# Patient Record
Sex: Male | Born: 1970 | Race: White | Hispanic: No | Marital: Married | Smoking: Current every day smoker
Health system: Southern US, Community
[De-identification: ages and names within clinical notes are randomized; demographics above are authoritative.]

## PROBLEM LIST (undated history)

## (undated) DIAGNOSIS — M549 Dorsalgia, unspecified: Secondary | ICD-10-CM

## (undated) DIAGNOSIS — K429 Umbilical hernia without obstruction or gangrene: Secondary | ICD-10-CM

## (undated) DIAGNOSIS — G8929 Other chronic pain: Secondary | ICD-10-CM

## (undated) DIAGNOSIS — I10 Essential (primary) hypertension: Secondary | ICD-10-CM

## (undated) HISTORY — PX: BACK SURGERY: SHX140

## (undated) HISTORY — PX: OTHER SURGICAL HISTORY: SHX169

---

## 2002-04-22 ENCOUNTER — Ambulatory Visit (HOSPITAL_COMMUNITY): Admission: RE | Admit: 2002-04-22 | Discharge: 2002-04-22 | Payer: Self-pay | Admitting: *Deleted

## 2002-04-22 ENCOUNTER — Encounter: Payer: Self-pay | Admitting: *Deleted

## 2002-06-19 ENCOUNTER — Emergency Department (HOSPITAL_COMMUNITY): Admission: EM | Admit: 2002-06-19 | Discharge: 2002-06-19 | Payer: Self-pay | Admitting: Emergency Medicine

## 2002-08-01 ENCOUNTER — Encounter: Payer: Self-pay | Admitting: General Surgery

## 2002-08-01 ENCOUNTER — Ambulatory Visit (HOSPITAL_COMMUNITY): Admission: RE | Admit: 2002-08-01 | Discharge: 2002-08-01 | Payer: Self-pay | Admitting: General Surgery

## 2002-08-06 ENCOUNTER — Ambulatory Visit (HOSPITAL_COMMUNITY): Admission: RE | Admit: 2002-08-06 | Discharge: 2002-08-06 | Payer: Self-pay | Admitting: General Surgery

## 2002-08-06 ENCOUNTER — Encounter: Payer: Self-pay | Admitting: General Surgery

## 2004-02-19 ENCOUNTER — Ambulatory Visit (HOSPITAL_COMMUNITY): Admission: RE | Admit: 2004-02-19 | Discharge: 2004-02-19 | Payer: Self-pay | Admitting: Family Medicine

## 2008-05-29 ENCOUNTER — Emergency Department (HOSPITAL_COMMUNITY): Admission: EM | Admit: 2008-05-29 | Discharge: 2008-05-29 | Payer: Self-pay | Admitting: Emergency Medicine

## 2008-10-02 ENCOUNTER — Encounter: Admission: RE | Admit: 2008-10-02 | Discharge: 2008-10-02 | Payer: Self-pay | Admitting: Neurosurgery

## 2008-11-25 ENCOUNTER — Inpatient Hospital Stay (HOSPITAL_COMMUNITY): Admission: RE | Admit: 2008-11-25 | Discharge: 2008-11-28 | Payer: Self-pay | Admitting: Neurosurgery

## 2009-07-20 ENCOUNTER — Encounter: Admission: RE | Admit: 2009-07-20 | Discharge: 2009-07-20 | Payer: Self-pay | Admitting: Neurosurgery

## 2009-09-04 ENCOUNTER — Encounter: Admission: RE | Admit: 2009-09-04 | Discharge: 2009-09-04 | Payer: Self-pay | Admitting: Neurosurgery

## 2009-10-03 ENCOUNTER — Emergency Department (HOSPITAL_COMMUNITY): Admission: EM | Admit: 2009-10-03 | Discharge: 2009-10-04 | Payer: Self-pay | Admitting: Emergency Medicine

## 2010-09-22 LAB — CBC
Hemoglobin: 13.6 g/dL (ref 13.0–17.0)
MCHC: 34.8 g/dL (ref 30.0–36.0)
MCV: 84.6 fL (ref 78.0–100.0)
RBC: 4.62 MIL/uL (ref 4.22–5.81)
RDW: 13.5 % (ref 11.5–15.5)
WBC: 9.4 10*3/uL (ref 4.0–10.5)

## 2010-09-22 LAB — BASIC METABOLIC PANEL
BUN: 22 mg/dL (ref 6–23)
Calcium: 9.4 mg/dL (ref 8.4–10.5)
Chloride: 106 mEq/L (ref 96–112)
Creatinine, Ser: 0.98 mg/dL (ref 0.4–1.5)
GFR calc Af Amer: >60 mL/min (ref >60)
GFR calc non Af Amer: >60 mL/min (ref >60)
Sodium: 140 mEq/L (ref 135–145)

## 2010-09-22 LAB — COMPREHENSIVE METABOLIC PANEL
Albumin: 4 g/dL (ref 3.5–5.2)
Alkaline Phosphatase: 73 U/L (ref 39–117)
BUN: 22 mg/dL (ref 6–23)
CO2: 26 mEq/L (ref 19–32)
Calcium: 9.3 mg/dL (ref 8.4–10.5)
Creatinine, Ser: 0.99 mg/dL (ref 0.4–1.5)
GFR calc non Af Amer: >60 mL/min (ref >60)

## 2010-09-22 LAB — DIFFERENTIAL
Eosinophils Relative: 4 % (ref 0–5)
Lymphocytes Relative: 55 % — ABNORMAL HIGH (ref 12–46)
Neutro Abs: 2.8 10*3/uL (ref 1.7–7.7)

## 2010-09-22 LAB — LIPASE, BLOOD: Lipase: 31 U/L (ref 11–59)

## 2010-10-12 LAB — CBC
HCT: 43.2 % (ref 39.0–52.0)
MCHC: 33.7 g/dL (ref 30.0–36.0)
Platelets: 187 10*3/uL (ref 150–400)
RBC: 5.02 MIL/uL (ref 4.22–5.81)
RDW: 13.3 % (ref 11.5–15.5)
WBC: 6.5 10*3/uL (ref 4.0–10.5)

## 2010-10-12 LAB — COMPREHENSIVE METABOLIC PANEL
ALT: 44 U/L (ref 0–53)
Albumin: 4.2 g/dL (ref 3.5–5.2)
CO2: 27 mEq/L (ref 19–32)
Calcium: 9.9 mg/dL (ref 8.4–10.5)
Chloride: 107 mEq/L (ref 96–112)
GFR calc Af Amer: >60 mL/min (ref >60)
Potassium: 4.2 mEq/L (ref 3.5–5.1)
Sodium: 140 mEq/L (ref 135–145)

## 2010-10-12 LAB — URINALYSIS, ROUTINE W REFLEX MICROSCOPIC
Bilirubin Urine: NEGATIVE
Glucose, UA: NEGATIVE mg/dL
Nitrite: NEGATIVE
Protein, ur: NEGATIVE mg/dL
Specific Gravity, Urine: 1.034 — ABNORMAL HIGH (ref 1.005–1.030)
pH: 5.5 (ref 5.0–8.0)

## 2010-10-12 LAB — DIFFERENTIAL
Basophils Relative: 1 % (ref 0–1)
Lymphs Abs: 2.8 10*3/uL (ref 0.7–4.0)
Monocytes Absolute: 0.5 10*3/uL (ref 0.1–1.0)

## 2010-10-12 LAB — PROTIME-INR: INR: 0.9 (ref 0.00–1.49)

## 2010-10-12 LAB — TYPE AND SCREEN
ABO/RH(D): A POS
Antibody Screen: NEGATIVE

## 2010-11-16 NOTE — Discharge Summary (Signed)
NAME:  [place], [place]                ACCOUNT NO.:  [id number]   MEDICAL RECORD NO.:  [id number]          PATIENT TYPE:  INP   LOCATION:  3029                         FACILITY:  [facility]   PHYSICIAN:  Payton Doughty, M.D.      DATE OF BIRTH:  02-13-1971   DATE OF ADMISSION:  11/25/2008  DATE OF DISCHARGE:  11/28/2008                               DISCHARGE SUMMARY   ADMITTING DIAGNOSIS:  Spondylosis at L5-S1.   DISCHARGE DIAGNOSIS:  Spondylosis at L5-S1.   OPERATIVE PROCEDURE:  L5-S1 laminectomy, diskectomy, posterior lumbar  interbody fusion, nonsegmental pedicle screw fixation.   DICTATING DOCTOR:  Payton Doughty, MD   COMPLICATIONS:  None.   DISCHARGE STATUS:  Alive and well.   BODY TEXT:  A 40 year old right-handed white gentleman whose history and  physical is recounted in the chart, and had two diskectomies by the  doctors in the past, had left leg pain, positive diskography and is  admitted for fusion.  Medical history is fairly unremarkable.  He was  admitted after ascertaining normal laboratory values and underwent L5-S1  fusion.  Postoperatively, he has done well.  Foley was removed.  First  day had to be replaced for one night.  Now, he is eating and voiding on  his own.  PCA was stopped on second postoperative day.  He is on oral  pain medications.  His incision is dry and well healing.  His strength  is full.  His leg pain has gone.  He has been discharged home in the  care of his family.  His follow up will be in the [place] offices in a  week for sutures.           ______________________________  Payton Doughty, M.D.     MWR/MEDQ  D:  11/28/2008  T:  11/28/2008  Job:  [id number]

## 2010-11-16 NOTE — H&P (Signed)
NAME:  [place] Murphy, Clinton Murphy                ACCOUNT NO.:  [id number]   MEDICAL RECORD NO.:  [id number]          PATIENT TYPE:  INP   LOCATION:  2899                         FACILITY:  [facility]   PHYSICIAN:  Payton Doughty, M.D.      DATE OF BIRTH:  July 20, 1970   DATE OF ADMISSION:  11/25/2008  DATE OF DISCHARGE:                              HISTORY & PHYSICAL   ADMITTING DIAGNOSIS:  Spondylosis, L5-S1.   BODY TEXT:  A 40 year old right-handed white gentleman in November 2009  when cutting wood had the sharp onset of pain in his back and down his  left leg.  Occasional pain in his right, but mostly it is in his left.  It is worse when he is up and about.  Underwent discography.  It was  strongly and concordantly positive at L5-S1.  He has done epidural  steroids, which were not helpful and he is admitted now for fusion.   PAST MEDICAL HISTORY:  Remarkable for mental illness.  He does not have  any medication since he is not having any trouble at this time.   MEDICATIONS:  He is only taking Lortab.   ALLERGIES:  Has no allergies.   PAST SURGICAL HISTORY:  Disk operation in 2000, disk operation in 2006,  and elbow operation in 2002.   SOCIAL HISTORY:  She smokes a pack a day.  Does not drink alcohol, and  is on disability.   FAMILY HISTORY:  Mom is 47, has breast cancer.  Dad died at 90 of  pneumonia.   REVIEW OF SYSTEMS:  CONSTITUTIONAL:  Remarkable for tenderness, leg  pain, leg weakness, and back pain.  HEENT:  Within normal limits.  NECK:  He has reasonable range of motion of the neck.  CHEST:  Clear.  CARDIAC:  Regular rate and rhythm.  ABDOMEN:  Soft and nontender with no hepatosplenomegaly.  EXTREMITIES:  No clubbing or cyanosis.  Peripheral pulses are good.  GU:  Deferred.  NEUROLOGIC:  He is awake, alert, and oriented.  Cranial nerves are  intact.  Motor exam shows 5/5 strength throughout the upper and lower  extremities.  He has sensory dysesthesias in the left S1 distribution.  Reflexes are 2 at the knees, 1 at the right ankle, and absent at the  left.  Straight leg raise is not particularly positive.  MR and lumbar  discography are positive at L5-S1, L4-5 is relatively well preserved.  On the discography, it was indifferent.   CLINICAL IMPRESSION:  Severe lumbar spondylosis at L5-S1.   PLAN:  Lumbar laminectomy, discectomy, posterior lumbar interbody  fusion, possible pedicle screws.  The risks and benefits have been  discussed with me, and he wished to proceed.           ______________________________  Payton Doughty, M.D.     MWR/MEDQ  D:  11/25/2008  T:  11/25/2008  Job:  [id number]

## 2010-11-16 NOTE — Op Note (Signed)
NAME:  [place] Murphy, Clinton Murphy                ACCOUNT NO.:  [id number]   MEDICAL RECORD NO.:  [id number]          PATIENT TYPE:  INP   LOCATION:  3029                         FACILITY:  [facility]   PHYSICIAN:  Payton Doughty, M.D.      DATE OF BIRTH:  October 02, 1970   DATE OF PROCEDURE:  11/25/2008  DATE OF DISCHARGE:                               OPERATIVE REPORT   PREOPERATIVE DIAGNOSIS:  Spondylosis and disk at L5-S1.   POSTOPERATIVE DIAGNOSIS:  Spondylosis and disk at L5-S1.   OPERATIVE PROCEDURES:  L5-S1 laminectomy and posterior lumbar interbody  fusion with a PEEK cage nonsegmental pedicle screw fixation at L5-S1 and  posterolateral arthrodesis, L5-S1.   SURGEON:  Payton Doughty, MD   ANESTHESIA:  General endotracheal.   PREPARATION:  Prepped and draped with alcohol wipe.   COMPLICATIONS:  None.   NURSE ASSISTANT:  Bedelia Person, MD   DOCTOR ASSISTANT:  Cristi Loron, MD   BODY OF TEXT:  A 40 year old gentleman with severe spondylosis at 5-1.  Taken to operating room, smoothly anesthetized and intubated, placed  prone on the operating table.  Following shave, prep and drape in the  usual sterile fashion, the scar was reopened, dissected down to the  spinous process of 3 and spinous process of 1 that were remaining.  Working through the scar, the pedicle of L5-S1 and the transverse  process and pedicle of L5 and the sacral ala were all dissected free.  Intraoperative x-ray confirmed correctness of level.  Having confirmed  correctness of level, the pars interarticularis, remaining lamina, and  inferior facet of L5 was removed bilaterally as well as the superior  portion of the superior facet of S1.  This allowed access to the  foraminal area bilaterally.  On the left side, there was extremely  scarred nerve root, was quite large and very scarred in and there was  very little interpedicular distance.  It was certainly not possible to  mobilize the root and get into the disk space at that  level.  On the  right side which had previous apparently not been operated, there was  some epidural fat which could be dissected and the disk space accessed  in that fashion.  Following access of the disk space, diskectomy was  carried out and 9-mm, 4-degree lordosis PEEK cage was tapped into place  after it was packed with bone graft harvested from the facets.  Pedicles  were then probed, tapped, and head screws placed bilaterally.  Intraoperative x-ray showed one screw was miss directed and it was  retracted without difficulty.  Rods were attached and locked on.  The  transverse process of 5 and the sacral ala were decorticated and packed  with bone graft, mixed with the patient's own  bone and BMP.  Final x-ray showed good placement of rods, screws, and  interbody graft.  Successive layers of 0 Vicryl, 2-0 Vicryl, and 3-0  nylon were used to close.  Betadine and Telfa dressing was applied, made  occlusive with OpSite.  The patient returned to the recovery room in  good condition.  ______________________________  Payton Doughty, M.D.     MWR/MEDQ  D:  11/25/2008  T:  11/26/2008  Job:  [id number]

## 2010-11-27 ENCOUNTER — Emergency Department (HOSPITAL_COMMUNITY)
Admission: EM | Admit: 2010-11-27 | Discharge: 2010-11-28 | Disposition: A | Discharge status: Home / Self Care | Payer: Medicaid Other | Attending: Emergency Medicine | Admitting: Emergency Medicine

## 2010-11-27 DIAGNOSIS — F17200 Nicotine dependence, unspecified, uncomplicated: Secondary | ICD-10-CM | POA: Insufficient documentation

## 2010-11-27 DIAGNOSIS — I10 Essential (primary) hypertension: Secondary | ICD-10-CM | POA: Insufficient documentation

## 2010-11-27 DIAGNOSIS — L0231 Cutaneous abscess of buttock: Secondary | ICD-10-CM | POA: Insufficient documentation

## 2011-01-12 IMAGING — CR DG LUMBAR SPINE COMPLETE 4+V
1 series · 1 of 1 positions shown · non-contrast
Comparison: CT 10/02/2008

CLINICAL DATA: L5-S1 laminectomy/discectomy/effusion

LUMBAR SPINE - COMPLETE 4+ VIEW

[view not recorded]
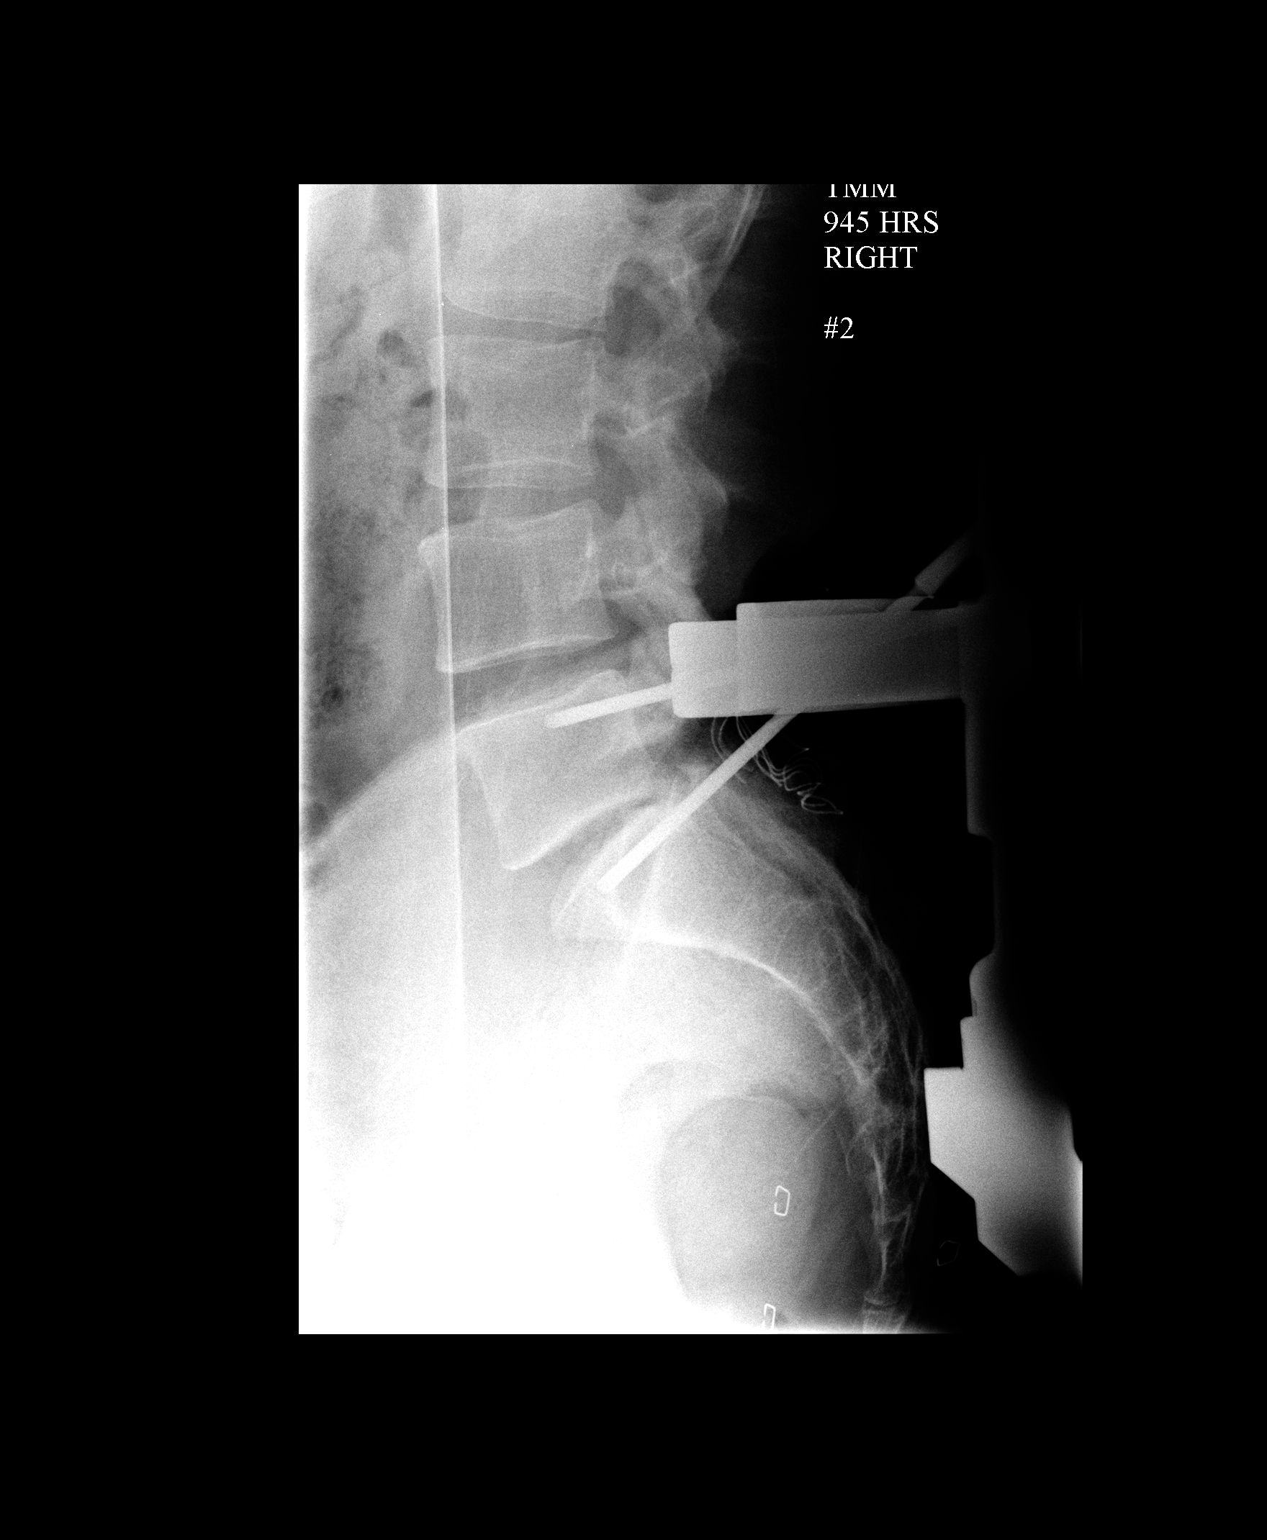

[1 of 1 positions shown; findings below may reference images not displayed]

FINDINGS: Four cross-table lateral views are received.  Film #1 was
taken 8778 hours revealing retractors in position and pointer aimed
at the mid aspect of L5 body.  Film #2 was taken 1673 hours
revealing two pointers, one projected over the superior aspect of
L5 body and the other over the superior aspect of S1 body.  Film #3
was taken at 9999 hours revealing single screws at L5 and S1. Film
#4 was taken at 4381 hours and reveals paired transpedicle screws
at L5 and S1.  No spondylolisthesis.
IMPRESSION: Satisfactory appearance of transpedicle screws at L5-S1.

## 2011-01-12 IMAGING — CR DG LUMBAR SPINE COMPLETE 4+V
1 series · 1 of 1 positions shown · non-contrast
Comparison: CT 10/02/2008

CLINICAL DATA: L5-S1 laminectomy/discectomy/effusion

LUMBAR SPINE - COMPLETE 4+ VIEW

[view not recorded]
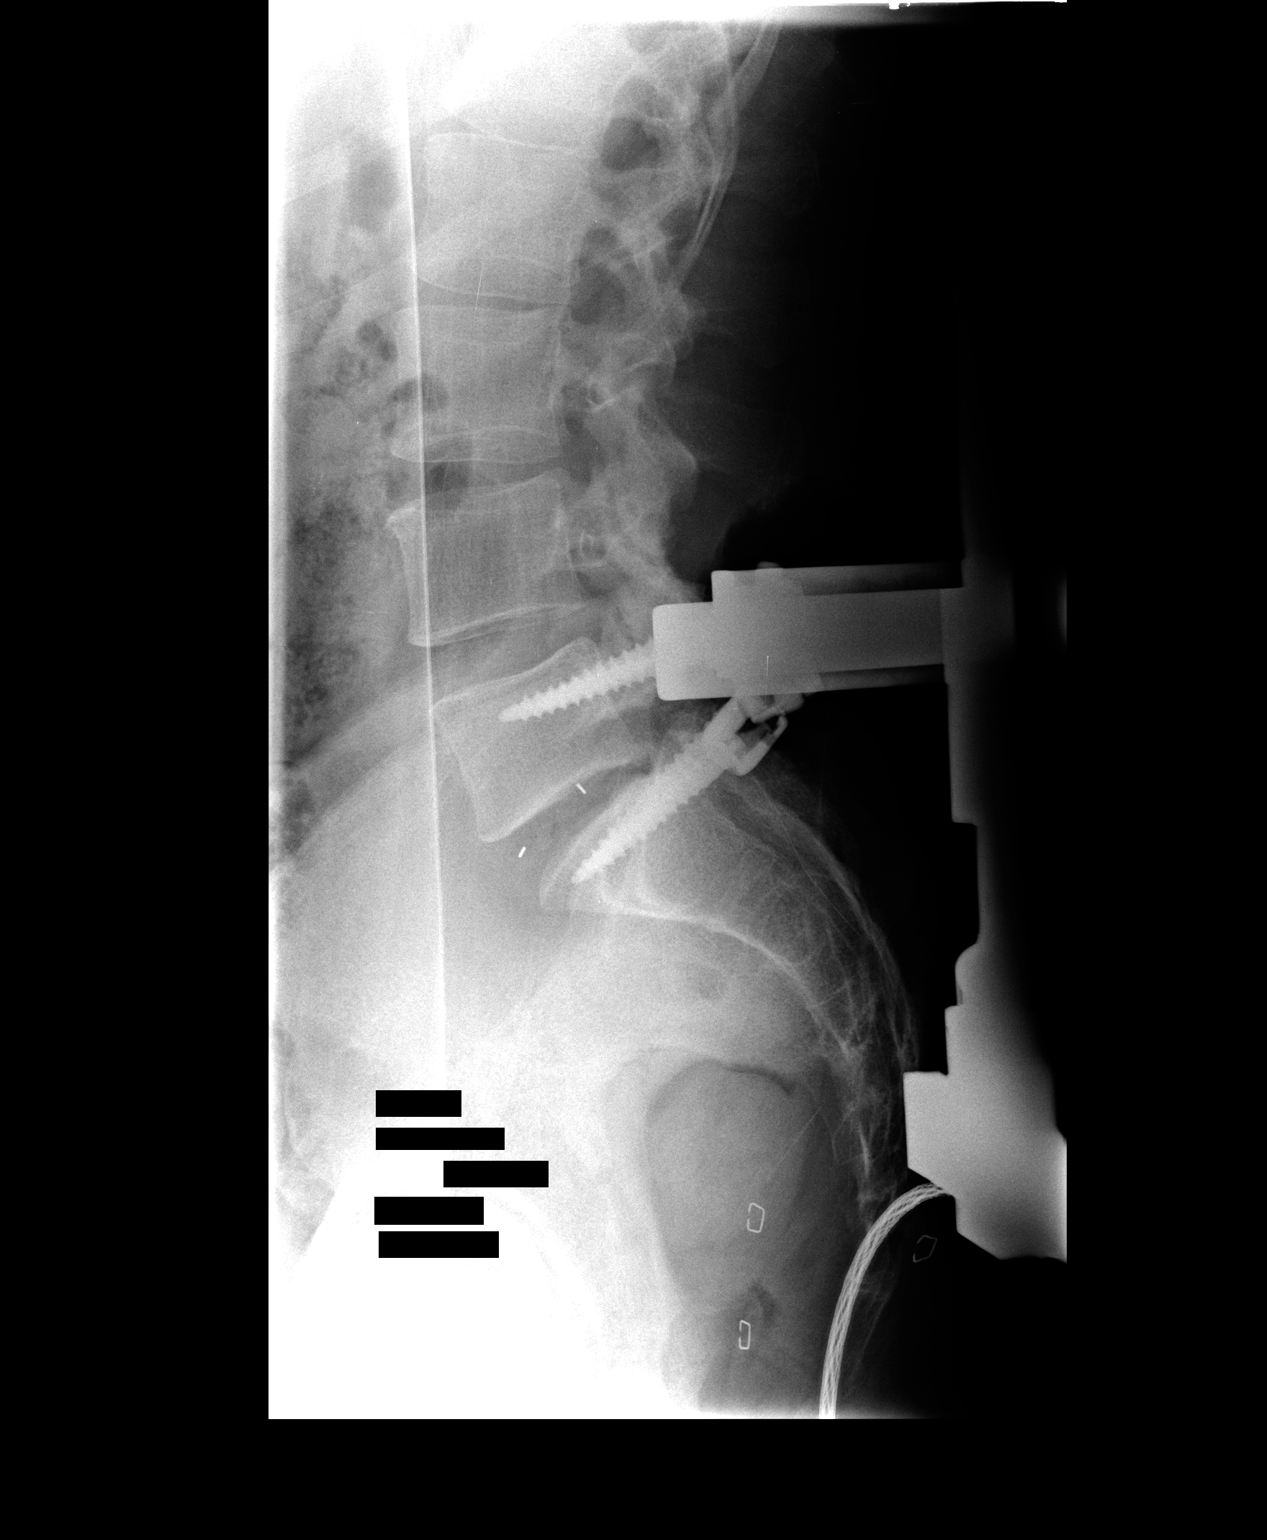

[1 of 1 positions shown; findings below may reference images not displayed]

FINDINGS: Four cross-table lateral views are received.  Film #1 was
taken 8778 hours revealing retractors in position and pointer aimed
at the mid aspect of L5 body.  Film #2 was taken 1673 hours
revealing two pointers, one projected over the superior aspect of
L5 body and the other over the superior aspect of S1 body.  Film #3
was taken at 9999 hours revealing single screws at L5 and S1. Film
#4 was taken at 4381 hours and reveals paired transpedicle screws
at L5 and S1.  No spondylolisthesis.
IMPRESSION: Satisfactory appearance of transpedicle screws at L5-S1.

## 2013-06-13 ENCOUNTER — Encounter (HOSPITAL_COMMUNITY): Payer: Self-pay | Admitting: Emergency Medicine

## 2013-06-13 ENCOUNTER — Emergency Department (HOSPITAL_COMMUNITY)
Admission: EM | Admit: 2013-06-13 | Discharge: 2013-06-13 | Disposition: A | Discharge status: Home / Self Care | Payer: Medicare Other | Attending: Emergency Medicine | Admitting: Emergency Medicine

## 2013-06-13 DIAGNOSIS — F17200 Nicotine dependence, unspecified, uncomplicated: Secondary | ICD-10-CM | POA: Insufficient documentation

## 2013-06-13 DIAGNOSIS — G8929 Other chronic pain: Secondary | ICD-10-CM | POA: Insufficient documentation

## 2013-06-13 DIAGNOSIS — H6692 Otitis media, unspecified, left ear: Secondary | ICD-10-CM

## 2013-06-13 DIAGNOSIS — H6690 Otitis media, unspecified, unspecified ear: Secondary | ICD-10-CM | POA: Insufficient documentation

## 2013-06-13 HISTORY — DX: Other chronic pain: G89.29

## 2013-06-13 HISTORY — DX: Dorsalgia, unspecified: M54.9

## 2013-06-13 MED ORDER — AMOXICILLIN 500 MG PO CAPS: 500.0000 mg | ORAL_CAPSULE | Freq: Three times a day (TID) | ORAL | Status: DC

## 2013-06-13 MED ORDER — HYDROCODONE-ACETAMINOPHEN 5-325 MG PO TABS: 2.0000 | ORAL_TABLET | ORAL | Status: DC | PRN

## 2013-06-13 NOTE — ED Notes (Signed)
Left ear pain x 2 days

## 2013-06-13 NOTE — ED Provider Notes (Signed)
CSN: [id number]     Arrival date & time 06/13/13  1547 History   First MD Initiated Contact with Patient 06/13/13 1610     Chief Complaint  Patient presents with  . Otalgia   (Consider location/radiation/quality/duration/timing/severity/associated sxs/prior Treatment) HPI Comments: Patient here with 2 day history of left ear pain - states this started gradually, has been trying to wash the ear out with hydrogen peroxide, this has not helped.  Has noticed drainage the first day, none now.  Reports tinnitis is chronic.  Denies fever, chills, sore throat, nausea, vomiting, headache.  Patient is a 42 y.o. male presenting with ear pain. The history is provided by the patient and the spouse. No language interpreter was used.  Otalgia Location:  Left Behind ear:  No abnormality Quality:  Throbbing Severity:  Moderate Onset quality:  Gradual Duration:  2 days Timing:  Constant Progression:  Worsening Chronicity:  New Context: not direct blow, not foreign body in ear and no water in ear   Relieved by:  Nothing Worsened by:  Nothing tried Ineffective treatments:  None tried Associated symptoms: tinnitus   Associated symptoms: no ear discharge, no fever, no headaches, no hearing loss, no rash, no rhinorrhea and no sore throat   Risk factors: no chronic ear infection and no prior ear surgery     Past Medical History  Diagnosis Date  . Chronic back pain    Past Surgical History  Procedure Laterality Date  . Back surgery    . Arm surgery     No family history on file. History  Substance Use Topics  . Smoking status: Current Every Day Smoker    Types: Cigarettes  . Smokeless tobacco: Not on file  . Alcohol Use: No    Review of Systems  Constitutional: Negative for fever.  HENT: Positive for ear pain and tinnitus. Negative for ear discharge, hearing loss, rhinorrhea and sore throat.   Skin: Negative for rash.  Neurological: Negative for headaches.  All other systems reviewed and  are negative.    Allergies  Review of patient's allergies indicates no known allergies.  Home Medications   Current Outpatient Rx  Name  Route  Sig  Dispense  Refill  . HYDROcodone-acetaminophen (NORCO) 10-325 MG per tablet   Oral   Take 1 tablet by mouth every 6 (six) hours as needed. pain          BP 141/93  Pulse 74  Temp(Src) 98.4 F (36.9 C) (Oral)  Resp 18  Ht 5\' 5"  (1.651 m)  Wt 168 lb (76.204 kg)  BMI 27.96 kg/m2  SpO2 97% Physical Exam  Nursing note and vitals reviewed. Constitutional: He is oriented to person, place, and time. He appears well-developed and well-nourished. No distress.  HENT:  Head: Normocephalic and atraumatic.  Right Ear: External ear normal.  Nose: Nose normal.  Mouth/Throat: Oropharynx is clear and moist. No oropharyngeal exudate.  Pinna with erythema, no mastoid process tenderness, canal erythematous without exudate, TM bulging and erythematous.  Eyes: Conjunctivae are normal. Pupils are equal, round, and reactive to light. No scleral icterus.  Neck: Normal range of motion. Neck supple.  Cardiovascular: Normal rate, regular rhythm and normal heart sounds.  Exam reveals no gallop and no friction rub.   No murmur heard. Pulmonary/Chest: Effort normal and breath sounds normal. No respiratory distress. He has no wheezes. He has no rales. He exhibits no tenderness.  Abdominal: Soft. Bowel sounds are normal. He exhibits no distension. There is no tenderness.  Musculoskeletal: Normal range of motion. He exhibits no edema and no tenderness.  Lymphadenopathy:    He has no cervical adenopathy.  Neurological: He is alert and oriented to person, place, and time. No cranial nerve deficit. He exhibits normal muscle tone. Coordination normal.  Skin: Skin is warm and dry. No rash noted. No erythema. No pallor.  Psychiatric: He has a normal mood and affect. His behavior is normal. Judgment and thought content normal.    ED Course  Procedures (including  critical care time) Labs Review Labs Reviewed - No data to display Imaging Review No results found.  EKG Interpretation   None       MDM  Left OM  Patient here with left ear pain, no evidence of mastoiditis, no further drainage and no sign for otitis externa at this time.  Plan is to place on oral abx, short course of pain medication and return if needed.   Izola Price Marisue Humble, PA-C 06/13/13 1621

## 2013-06-14 NOTE — ED Provider Notes (Signed)
Medical screening examination/treatment/procedure(s) were performed by non-physician practitioner and as supervising physician I was immediately available for consultation/collaboration.  EKG Interpretation   None       Kaeley Vinje, MD, FACEP   Leshea Jaggers L Julyana Woolverton, MD 06/14/13 0013 

## 2013-07-29 ENCOUNTER — Encounter (HOSPITAL_COMMUNITY): Payer: Self-pay | Admitting: Emergency Medicine

## 2013-07-29 ENCOUNTER — Emergency Department (HOSPITAL_COMMUNITY)
Admission: EM | Admit: 2013-07-29 | Discharge: 2013-07-29 | Disposition: A | Discharge status: Home / Self Care | Payer: Medicare Other | Attending: Emergency Medicine | Admitting: Emergency Medicine

## 2013-07-29 DIAGNOSIS — F17200 Nicotine dependence, unspecified, uncomplicated: Secondary | ICD-10-CM | POA: Insufficient documentation

## 2013-07-29 DIAGNOSIS — G8929 Other chronic pain: Secondary | ICD-10-CM | POA: Insufficient documentation

## 2013-07-29 DIAGNOSIS — M549 Dorsalgia, unspecified: Secondary | ICD-10-CM | POA: Insufficient documentation

## 2013-07-29 DIAGNOSIS — IMO0002 Reserved for concepts with insufficient information to code with codable children: Secondary | ICD-10-CM | POA: Insufficient documentation

## 2013-07-29 DIAGNOSIS — K644 Residual hemorrhoidal skin tags: Secondary | ICD-10-CM | POA: Insufficient documentation

## 2013-07-29 MED ORDER — HYDROCODONE-ACETAMINOPHEN 5-325 MG PO TABS: 1.0000 | ORAL_TABLET | ORAL | Status: DC | PRN

## 2013-07-29 MED ORDER — HYDROCODONE-ACETAMINOPHEN 5-325 MG PO TABS
2.0000 | ORAL_TABLET | Freq: Once | ORAL | Status: AC
  Administered 2013-07-29: 2 via ORAL
  Filled 2013-07-29: qty 2

## 2013-07-29 MED ORDER — HYDROCORTISONE ACETATE 25 MG RE SUPP: 25.0000 mg | Freq: Three times a day (TID) | RECTAL | Status: DC

## 2013-07-29 NOTE — Discharge Instructions (Signed)
You have a large hemorrhoid present externally. Please call Dr. Lovell Sheehan for surgical evaluation of this hemorrhoid. Please use warm tub soaks 2-3 times daily. Please increase the fiber in your diet. Please use a stool softener until seen by the surgeon. Please use Anusol HC rectally 3 times daily. May use Norco if needed for pain. This medication may cause drowsiness, please use with caution. Hemorrhoids Hemorrhoids are swollen veins around the rectum or anus. There are two types of hemorrhoids:   Internal hemorrhoids. These occur in the veins just inside the rectum. They may poke through to the outside and become irritated and painful.  External hemorrhoids. These occur in the veins outside the anus and can be felt as a painful swelling or hard lump near the anus. CAUSES  Pregnancy.   Obesity.   Constipation or diarrhea.   Straining to have a bowel movement.   Sitting for long periods on the toilet.  Heavy lifting or other activity that caused you to strain.  Anal intercourse. SYMPTOMS   Pain.   Anal itching or irritation.   Rectal bleeding.   Fecal leakage.   Anal swelling.   One or more lumps around the anus.  DIAGNOSIS  Your caregiver may be able to diagnose hemorrhoids by visual examination. Other examinations or tests that may be performed include:   Examination of the rectal area with a gloved hand (digital rectal exam).   Examination of anal canal using a small tube (scope).   A blood test if you have lost a significant amount of blood.  A test to look inside the colon (sigmoidoscopy or colonoscopy). TREATMENT Most hemorrhoids can be treated at home. However, if symptoms do not seem to be getting better or if you have a lot of rectal bleeding, your caregiver may perform a procedure to help make the hemorrhoids get smaller or remove them completely. Possible treatments include:   Placing a rubber band at the base of the hemorrhoid to cut off the  circulation (rubber band ligation).   Injecting a chemical to shrink the hemorrhoid (sclerotherapy).   Using a tool to burn the hemorrhoid (infrared light therapy).   Surgically removing the hemorrhoid (hemorrhoidectomy).   Stapling the hemorrhoid to block blood flow to the tissue (hemorrhoid stapling).  HOME CARE INSTRUCTIONS   Eat foods with fiber, such as whole grains, beans, nuts, fruits, and vegetables. Ask your doctor about taking products with added fiber in them (fibersupplements).  Increase fluid intake. Drink enough water and fluids to keep your urine clear or pale yellow.   Exercise regularly.   Go to the bathroom when you have the urge to have a bowel movement. Do not wait.   Avoid straining to have bowel movements.   Keep the anal area dry and clean. Use wet toilet paper or moist towelettes after a bowel movement.   Medicated creams and suppositories may be used or applied as directed.   Only take over-the-counter or prescription medicines as directed by your caregiver.   Take warm sitz baths for 15 20 minutes, 3 4 times a day to ease pain and discomfort.   Place ice packs on the hemorrhoids if they are tender and swollen. Using ice packs between sitz baths may be helpful.   Put ice in a plastic bag.   Place a towel between your skin and the bag.   Leave the ice on for 15 20 minutes, 3 4 times a day.   Do not use a donut-shaped pillow or  sit on the toilet for long periods. This increases blood pooling and pain.  SEEK MEDICAL CARE IF:  You have increasing pain and swelling that is not controlled by treatment or medicine.  You have uncontrolled bleeding.  You have difficulty or you are unable to have a bowel movement.  You have pain or inflammation outside the area of the hemorrhoids. MAKE SURE YOU:  Understand these instructions.  Will watch your condition.  Will get help right away if you are not doing well or get worse. Document  Released: 06/17/2000 Document Revised: 06/06/2012 Document Reviewed: 04/24/2012 [facility]erneExitCare Patient Information 2014 [place], [place].  Fiber Content in Foods Drinking plenty of fluids and consuming foods high in fiber can help with constipation. See the list below for the fiber content of some common foods. Starches and Grains / Dietary Fiber (g)  Cheerios, 1 cup / 3 g  Kellogg's Corn Flakes, 1 cup / 0.7 g  Klopf Krispies, 1  cup / 0.3 g  Quaker Oat Life Cereal,  cup / 2.1 g  Oatmeal, instant (cooked),  cup / 2 g  Kellogg's Frosted Mini Wheats, 1 cup / 5.1 g  [place], brown, long-grain (cooked), 1 cup / 3.5 g  Hisle, white, long-grain (cooked), 1 cup / 0.6 g  Macaroni, cooked, enriched, 1 cup / 2.5 g Legumes / Dietary Fiber (g)  Beans, baked, canned, plain or vegetarian,  cup / 5.2 g  Beans, kidney, canned,  cup / 6.8 g  Beans, pinto, dried (cooked),  cup / 7.7 g  Beans, pinto, canned,  cup / 5.5 g Breads and Crackers / Dietary Fiber (g)  Graham crackers, plain or honey, 2 squares / 0.7 g  Saltine crackers, 3 squares / 0.3 g  Pretzels, plain, salted, 10 pieces / 1.8 g  Bread, whole-wheat, 1 slice / 1.9 g  Bread, white, 1 slice / 0.7 g  Bread, raisin, 1 slice / 1.2 g  Bagel, plain, 3 oz / 2 g  Tortilla, flour, 1 oz / 0.9 g  Tortilla, corn, 1 small / 1.5 g  Bun, hamburger or hotdog, 1 small / 0.9 g Fruits / Dietary Fiber (g)  Apple, raw with skin, 1 medium / 4.4 g  Applesauce, sweetened,  cup / 1.5 g  Banana,  medium / 1.5 g  Grapes, 10 grapes / 0.4 g  Orange, 1 small / 2.3 g  Raisin, 1.5 oz / 1.6 g  Melon, 1 cup / 1.4 g Vegetables / Dietary Fiber (g)  Green beans, canned,  cup / 1.3 g  Carrots (cooked),  cup / 2.3 g  Broccoli (cooked),  cup / 2.8 g  Peas, frozen (cooked),  cup / 4.4 g  Potatoes, mashed,  cup / 1.6 g  Lettuce, 1 cup / 0.5 g  Corn, canned,  cup / 1.6 g  Tomato,  cup / 1.1 g Document Released: 11/06/2006  Document Revised: 09/12/2011 Document Reviewed: 01/01/2007 Exit[employer] Patient Information 2014 [place], [place].

## 2013-07-29 NOTE — ED Notes (Signed)
Pt reports his hemorrhoids have fallen out and he has trouble putting back in his rectum per pt. Denies bleeding. Denies having trouble with bowels.

## 2013-07-29 NOTE — ED Provider Notes (Signed)
CSN: [id number]     Arrival date & time 07/29/13  1146 History   First MD Initiated Contact with Patient 07/29/13 1421     Chief Complaint  Patient presents with  . Hemorrhoids   (Consider location/radiation/quality/duration/timing/severity/associated sxs/prior Treatment) HPI Comments: Pt is a 43 y/o male who presents to the ED with c/o pain and  A "growth" on his rectum. No recent procedures. No recent injury. Pt reports some constipation at times, put this does not occur often. He states he does a lot of heavy lifting. He has not had this pain or growth in the past.   The history is provided by the patient.    Past Medical History  Diagnosis Date  . Chronic back pain    Past Surgical History  Procedure Laterality Date  . Back surgery    . Arm surgery     No family history on file. History  Substance Use Topics  . Smoking status: Current Every Day Smoker    Types: Cigarettes  . Smokeless tobacco: Not on file  . Alcohol Use: No    Review of Systems  Constitutional: Negative for activity change.       All ROS Neg except as noted in HPI  HENT: Negative for nosebleeds.   Eyes: Negative for photophobia and discharge.  Respiratory: Negative for cough, shortness of breath and wheezing.   Cardiovascular: Negative for chest pain and palpitations.  Gastrointestinal: Negative for abdominal pain and blood in stool.  Genitourinary: Negative for dysuria, frequency and hematuria.  Musculoskeletal: Positive for back pain. Negative for arthralgias and neck pain.  Skin: Negative.   Neurological: Negative for dizziness, seizures and speech difficulty.  Psychiatric/Behavioral: Negative for hallucinations and confusion.    Allergies  Review of patient's allergies indicates no known allergies.  Home Medications   Current Outpatient Rx  Name  Route  Sig  Dispense  Refill  . HYDROcodone-acetaminophen (NORCO) 10-325 MG per tablet   Oral   Take 1 tablet by mouth every 6 (six) hours as  needed. pain         . HYDROcodone-acetaminophen (NORCO) 5-325 MG per tablet   Oral   Take 1 tablet by mouth every 4 (four) hours as needed for moderate pain.   15 tablet   0   . hydrocortisone (ANUSOL-HC) 25 MG suppository   Rectal   Place 1 suppository (25 mg total) rectally 3 (three) times daily.   12 suppository   0    BP 138/100  Temp(Src) 97.7 F (36.5 C)  Resp 16  SpO2 98% Physical Exam  Nursing note and vitals reviewed. Constitutional: He is oriented to person, place, and time. He appears well-developed and well-nourished.  Non-toxic appearance.  HENT:  Head: Normocephalic.  Right Ear: Tympanic membrane and external ear normal.  Left Ear: Tympanic membrane and external ear normal.  Eyes: EOM and lids are normal. Pupils are equal, round, and reactive to light.  Neck: Normal range of motion. Neck supple. Carotid bruit is not present.  Cardiovascular: Normal rate, regular rhythm, normal heart sounds, intact distal pulses and normal pulses.   Pulmonary/Chest: Breath sounds normal. No respiratory distress.  Abdominal: Soft. Bowel sounds are normal. There is no tenderness. There is no guarding.  Genitourinary:   large tender external hemorrhoid at the 3:00 position. No bleeding noted.   Musculoskeletal: Normal range of motion.  Lymphadenopathy:       Head (right side): No submandibular adenopathy present.       Head (left  side): No submandibular adenopathy present.    He has no cervical adenopathy.  Neurological: He is alert and oriented to person, place, and time. He has normal strength. No cranial nerve deficit or sensory deficit.  Skin: Skin is warm and dry.  Psychiatric: He has a normal mood and affect. His speech is normal.    ED Course  Procedures (including critical care time) Labs Review Labs Reviewed - No data to display Imaging Review No results found.  EKG Interpretation   None       MDM   1. External hemorrhoid    *I have reviewed nursing  notes, vital signs, and all appropriate lab and imaging results for this patient.**  Exam is consistent with external hemorrhoid. Pt given rx for anusol-HC.  And norco. Pt advised to see surgeon as soon as possible for evaluation. He is also advised to soak in warm tub soaks. Pt to return to ED if excessive bleeding or intractable pain or temp elevation.  Kathie DikeHobson M Verneal Wiers, PA-C 08/01/13 1457

## 2013-07-29 NOTE — ED Notes (Signed)
Rectal pain with swelling, of hemorrhoidal tissue.

## 2013-08-01 ENCOUNTER — Encounter (HOSPITAL_COMMUNITY): Payer: Self-pay

## 2013-08-01 ENCOUNTER — Encounter (HOSPITAL_COMMUNITY)
Admission: RE | Admit: 2013-08-01 | Discharge: 2013-08-01 | Disposition: A | Discharge status: Home / Self Care | Payer: Medicare Other | Source: Ambulatory Visit | Attending: General Surgery | Admitting: General Surgery

## 2013-08-01 DIAGNOSIS — Z01812 Encounter for preprocedural laboratory examination: Secondary | ICD-10-CM | POA: Insufficient documentation

## 2013-08-01 DIAGNOSIS — Z01818 Encounter for other preprocedural examination: Secondary | ICD-10-CM | POA: Insufficient documentation

## 2013-08-01 LAB — CBC WITH DIFFERENTIAL/PLATELET
BASOS PCT: 1 % (ref 0–1)
Basophils Absolute: 0.1 10*3/uL (ref 0.0–0.1)
EOS ABS: 0.3 10*3/uL (ref 0.0–0.7)
EOS PCT: 4 % (ref 0–5)
HCT: 43.7 % (ref 39.0–52.0)
Hemoglobin: 15 g/dL (ref 13.0–17.0)
Lymphocytes Relative: 40 % (ref 12–46)
Lymphs Abs: 3.3 10*3/uL (ref 0.7–4.0)
MCH: 28.9 pg (ref 26.0–34.0)
MCHC: 34.3 g/dL (ref 30.0–36.0)
MCV: 84.2 fL (ref 78.0–100.0)
Monocytes Absolute: 0.8 10*3/uL (ref 0.1–1.0)
Monocytes Relative: 10 % (ref 3–12)
Neutro Abs: 3.7 10*3/uL (ref 1.7–7.7)
Neutrophils Relative %: 46 % (ref 43–77)
PLATELETS: 227 10*3/uL (ref 150–400)
RBC: 5.19 MIL/uL (ref 4.22–5.81)
RDW: 13.7 % (ref 11.5–15.5)
WBC: 8.2 10*3/uL (ref 4.0–10.5)

## 2013-08-01 LAB — BASIC METABOLIC PANEL
BUN: 20 mg/dL (ref 6–23)
CALCIUM: 9.9 mg/dL (ref 8.4–10.5)
CO2: 30 meq/L (ref 19–32)
CREATININE: 1.14 mg/dL (ref 0.50–1.35)
Chloride: 100 mEq/L (ref 96–112)
GFR calc Af Amer: 89 mL/min — ABNORMAL LOW (ref >90)
GFR calc non Af Amer: 77 mL/min — ABNORMAL LOW (ref >90)
GLUCOSE: 93 mg/dL (ref 70–99)
Potassium: 4.4 mEq/L (ref 3.7–5.3)
Sodium: 141 mEq/L (ref 137–147)

## 2013-08-01 MED ORDER — CHLORHEXIDINE GLUCONATE 4 % EX LIQD: 1.0000 "application " | Freq: Once | CUTANEOUS | Status: DC

## 2013-08-01 NOTE — Patient Instructions (Signed)
Clinton Murphy  08/01/2013   Your procedure is scheduled on:  08/05/2013  Report to [facility] at  720  AM.  Call this number if you have problems the morning of surgery: [phone]   Remember:   Do not eat food or drink liquids after midnight.   Take these medicines the morning of surgery with A SIP OF WATER:  hydrocodone   Do not wear jewelry, make-up or nail polish.  Do not wear lotions, powders, or perfumes.   Do not shave 48 hours prior to surgery. Men may shave face and neck.  Do not bring valuables to the hospital.  [facility] is not responsible for any belongings or valuables.               Contacts, dentures or bridgework may not be worn into surgery.  Leave suitcase in the car. After surgery it may be brought to your room.  For patients admitted to the hospital, discharge time is determined by your treatment team.               Patients discharged the day of surgery will not be allowed to drive home.  Name and phone number of your driver: family  Special Instructions: Shower using CHG 2 nights before surgery and the night before surgery.  If you shower the day of surgery use CHG.  Use special wash - you have one bottle of CHG for all showers.  You should use approximately 1/3 of the bottle for each shower.   Please read over the following fact sheets that you were given: Pain Booklet, Coughing and Deep Breathing, Surgical Site Infection Prevention, Anesthesia Post-op Instructions and Care and Recovery After Surgery Hemorrhoids Hemorrhoids are swollen veins around the rectum or anus. There are two types of hemorrhoids:   Internal hemorrhoids. These occur in the veins just inside the rectum. They may poke through to the outside and become irritated and painful.  External hemorrhoids. These occur in the veins outside the anus and can be felt as a painful swelling or hard lump near the anus. CAUSES  Pregnancy.   Obesity.   Constipation or diarrhea.   Straining  to have a bowel movement.   Sitting for long periods on the toilet.  Heavy lifting or other activity that caused you to strain.  Anal intercourse. SYMPTOMS   Pain.   Anal itching or irritation.   Rectal bleeding.   Fecal leakage.   Anal swelling.   One or more lumps around the anus.  DIAGNOSIS  Your caregiver may be able to diagnose hemorrhoids by visual examination. Other examinations or tests that may be performed include:   Examination of the rectal area with a gloved hand (digital rectal exam).   Examination of anal canal using a small tube (scope).   A blood test if you have lost a significant amount of blood.  A test to look inside the colon (sigmoidoscopy or colonoscopy). TREATMENT Most hemorrhoids can be treated at home. However, if symptoms do not seem to be getting better or if you have a lot of rectal bleeding, your caregiver may perform a procedure to help make the hemorrhoids get smaller or remove them completely. Possible treatments include:   Placing a rubber band at the base of the hemorrhoid to cut off the circulation (rubber band ligation).   Injecting a chemical to shrink the hemorrhoid (sclerotherapy).   Using a tool to burn the hemorrhoid (infrared light therapy).  Surgically removing the hemorrhoid (hemorrhoidectomy).   Stapling the hemorrhoid to block blood flow to the tissue (hemorrhoid stapling).  HOME CARE INSTRUCTIONS   Eat foods with fiber, such as whole grains, beans, nuts, fruits, and vegetables. Ask your doctor about taking products with added fiber in them (fibersupplements).  Increase fluid intake. Drink enough water and fluids to keep your urine clear or pale yellow.   Exercise regularly.   Go to the bathroom when you have the urge to have a bowel movement. Do not wait.   Avoid straining to have bowel movements.   Keep the anal area dry and clean. Use wet toilet paper or moist towelettes after a bowel  movement.   Medicated creams and suppositories may be used or applied as directed.   Only take over-the-counter or prescription medicines as directed by your caregiver.   Take warm sitz baths for 15 20 minutes, 3 4 times a day to ease pain and discomfort.   Place ice packs on the hemorrhoids if they are tender and swollen. Using ice packs between sitz baths may be helpful.   Put ice in a plastic bag.   Place a towel between your skin and the bag.   Leave the ice on for 15 20 minutes, 3 4 times a day.   Do not use a donut-shaped pillow or sit on the toilet for long periods. This increases blood pooling and pain.  SEEK MEDICAL CARE IF:  You have increasing pain and swelling that is not controlled by treatment or medicine.  You have uncontrolled bleeding.  You have difficulty or you are unable to have a bowel movement.  You have pain or inflammation outside the area of the hemorrhoids. MAKE SURE YOU:  Understand these instructions.  Will watch your condition.  Will get help right away if you are not doing well or get worse. Document Released: 06/17/2000 Document Revised: 06/06/2012 Document Reviewed: 04/24/2012 [facility] Patient Information 2014 [place], [place]. PATIENT INSTRUCTIONS POST-ANESTHESIA  IMMEDIATELY FOLLOWING SURGERY:  Do not drive or operate machinery for the first twenty four hours after surgery.  Do not make any important decisions for twenty four hours after surgery or while taking narcotic pain medications or sedatives.  If you develop intractable nausea and vomiting or a severe headache please notify your doctor immediately.  FOLLOW-UP:  Please make an appointment with your surgeon as instructed. You do not need to follow up with anesthesia unless specifically instructed to do so.  WOUND CARE INSTRUCTIONS (if applicable):  Keep a dry clean dressing on the anesthesia/puncture wound site if there is drainage.  Once the wound has quit draining you may  leave it open to air.  Generally you should leave the bandage intact for twenty four hours unless there is drainage.  If the epidural site drains for more than 36-48 hours please call the anesthesia department.  QUESTIONS?:  Please feel free to call your physician or the hospital operator if you have any questions, and they will be happy to assist you.

## 2013-08-01 NOTE — H&P (Signed)
  NTS SOAP Note  Vital Signs:  Vitals as of: 08/01/2013: Systolic 153: Diastolic 94: Heart Rate 70: Temp 98.4F: Height 5ft 5in: Weight 174Lbs 0 Ounces: Pain Level 9: BMI 28.95  BMI : 28.95 kg/m2  Subjective: This 43 Years 9 Months old Male presents for of    HEMORRHOIDS: ,Has developed significantly swollen hemorrhoids over the past week.  Has had a h/o hemorrhoidal disease.  Creams have not been helpful.  Seen in ER recently.  Review of Symptoms:  Constitutional:unremarkable   Head:unremarkable    Eyes:unremarkable   Nose/Mouth/Throat:unremarkable Cardiovascular:  unremarkable   Respiratory:unremarkable   Gastrointestinal:  unremarkable   Genitourinary:unremarkable       back pain Skin:unremarkable Hematolgic/Lymphatic:unremarkable     Allergic/Immunologic:unremarkable     Past Medical History:    Reviewed  Past Medical History  Surgical History: back surgery x 4 Medical Problems: none Allergies: nkda Medications: norco   Social History:Reviewed  Social History  Preferred Language: English Race:  White Ethnicity: Not Hispanic / Latino Age: 43 Years 9 Months Marital Status:  S Alcohol: yes   Smoking Status: Current every day smoker reviewed on 08/01/2013 Started Date: 07/04/1988 Packs per day: 1.00 Functional Status reviewed on 08/01/2013 ------------------------------------------------ Bathing: Normal Cooking: Normal Dressing: Normal Driving: Normal Eating: Normal Managing Meds: Normal Oral Care: Normal Shopping: Normal Toileting: Normal Transferring: Normal Walking: Normal Cognitive Status reviewed on 08/01/2013 ------------------------------------------------ Attention: Normal Decision Making: Normal Language: Normal Memory: Normal Motor: Normal Perception: Normal Problem Solving: Normal Visual and Spatial: Normal   Family History:  Reviewed  Family Health History Mother, Deceased; Breast  cancer; Lung cancer;  Father, Deceased; Healthy; mesothelioma    Objective Information: General:  Well appearing, well nourished in no distress. Neck:  Supple without lymphadenopathy.  Heart:  RRR, no murmur Lungs:    CTA bilaterally, no wheezes, rhonchi, rales.  Breathing unlabored. Abdomen:Soft, NT/ND, no HSM, no masses.   Extensive circumferential hemorrhoidal disease present.  Exam limited secondary to pain.  Assessment:Hemorrhoidal disease  Diagnoses: 455.6 Hemorrhoids (Fourth degree hemorrhoids)  Procedures: 99203 - OFFICE OUTPATIENT NEW 30 MINUTES    Plan:Scheduled for extensive hemorrhoidectomy on 08/05/13.   Patient Education:Alternative treatments to surgery were discussed with patient (and family).  Risks and benefits  of procedure including bleeding, infection, and recurrence of the hemorrhoids were fully explained to the patient (and family) who gave informed consent. Patient/family questions were addressed.  Follow-up:Pending Surgery 

## 2013-08-05 ENCOUNTER — Ambulatory Visit (HOSPITAL_COMMUNITY): Payer: Medicare Other | Admitting: Anesthesiology

## 2013-08-05 ENCOUNTER — Ambulatory Visit (HOSPITAL_COMMUNITY)
Admission: RE | Admit: 2013-08-05 | Discharge: 2013-08-05 | Disposition: A | Discharge status: Home / Self Care | Payer: Medicare Other | Source: Ambulatory Visit | Attending: General Surgery | Admitting: General Surgery

## 2013-08-05 ENCOUNTER — Encounter (HOSPITAL_COMMUNITY): Payer: Medicare Other | Admitting: Anesthesiology

## 2013-08-05 ENCOUNTER — Encounter (HOSPITAL_COMMUNITY)
Admission: RE | Disposition: A | Discharge status: Home / Self Care | Payer: Self-pay | Source: Ambulatory Visit | Attending: General Surgery

## 2013-08-05 ENCOUNTER — Encounter (HOSPITAL_COMMUNITY): Payer: Self-pay | Admitting: *Deleted

## 2013-08-05 DIAGNOSIS — Z6828 Body mass index (BMI) 28.0-28.9, adult: Secondary | ICD-10-CM | POA: Insufficient documentation

## 2013-08-05 DIAGNOSIS — F17200 Nicotine dependence, unspecified, uncomplicated: Secondary | ICD-10-CM | POA: Insufficient documentation

## 2013-08-05 DIAGNOSIS — K644 Residual hemorrhoidal skin tags: Secondary | ICD-10-CM | POA: Insufficient documentation

## 2013-08-05 DIAGNOSIS — K648 Other hemorrhoids: Secondary | ICD-10-CM | POA: Insufficient documentation

## 2013-08-05 HISTORY — PX: HEMORRHOID SURGERY: SHX153

## 2013-08-05 SURGERY — HEMORRHOIDECTOMY
Anesthesia: General

## 2013-08-05 MED ORDER — ONDANSETRON HCL 4 MG/2ML IJ SOLN
4.0000 mg | Freq: Once | INTRAMUSCULAR | Status: AC
  Administered 2013-08-05: 4 mg via INTRAVENOUS

## 2013-08-05 MED ORDER — FENTANYL CITRATE 0.05 MG/ML IJ SOLN
INTRAMUSCULAR | Status: AC
  Filled 2013-08-05: qty 2

## 2013-08-05 MED ORDER — BUPIVACAINE HCL (PF) 0.5 % IJ SOLN
INTRAMUSCULAR | Status: AC
  Filled 2013-08-05: qty 30

## 2013-08-05 MED ORDER — LIDOCAINE VISCOUS 2 % MT SOLN
OROMUCOSAL | Status: DC | PRN
  Administered 2013-08-05: 20 mL

## 2013-08-05 MED ORDER — FENTANYL CITRATE 0.05 MG/ML IJ SOLN
25.0000 ug | INTRAMUSCULAR | Status: DC | PRN
  Administered 2013-08-05: 50 ug via INTRAVENOUS
  Administered 2013-08-05 (×2): 25 ug via INTRAVENOUS
  Administered 2013-08-05 (×2): 50 ug via INTRAVENOUS

## 2013-08-05 MED ORDER — SODIUM CHLORIDE 0.9 % IR SOLN
Status: DC | PRN
  Administered 2013-08-05: 1000 mL

## 2013-08-05 MED ORDER — HYDROMORPHONE HCL PF 1 MG/ML IJ SOLN
INTRAMUSCULAR | Status: AC
  Filled 2013-08-05: qty 1

## 2013-08-05 MED ORDER — GLYCOPYRROLATE 0.2 MG/ML IJ SOLN
INTRAMUSCULAR | Status: AC
  Filled 2013-08-05: qty 1

## 2013-08-05 MED ORDER — HEMOSTATIC AGENTS (NO CHARGE) OPTIME
TOPICAL | Status: DC | PRN
  Administered 2013-08-05: 1 via TOPICAL

## 2013-08-05 MED ORDER — MIDAZOLAM HCL 2 MG/2ML IJ SOLN
INTRAMUSCULAR | Status: AC
  Filled 2013-08-05: qty 2

## 2013-08-05 MED ORDER — FENTANYL CITRATE 0.05 MG/ML IJ SOLN
INTRAMUSCULAR | Status: AC
  Filled 2013-08-05: qty 5

## 2013-08-05 MED ORDER — FENTANYL CITRATE 0.05 MG/ML IJ SOLN
25.0000 ug | INTRAMUSCULAR | Status: AC
  Administered 2013-08-05 (×2): 25 ug via INTRAVENOUS

## 2013-08-05 MED ORDER — DEXTROSE 5 % IV SOLN
INTRAVENOUS | Status: AC
  Filled 2013-08-05: qty 2

## 2013-08-05 MED ORDER — KETOROLAC TROMETHAMINE 30 MG/ML IJ SOLN
30.0000 mg | Freq: Once | INTRAMUSCULAR | Status: AC
  Administered 2013-08-05: 30 mg via INTRAVENOUS
  Filled 2013-08-05: qty 1

## 2013-08-05 MED ORDER — HYDROMORPHONE HCL PF 1 MG/ML IJ SOLN
1.0000 mg | INTRAMUSCULAR | Status: DC | PRN
  Administered 2013-08-05 (×3): 1 mg via INTRAVENOUS
  Filled 2013-08-05: qty 1

## 2013-08-05 MED ORDER — LACTATED RINGERS IV SOLN
INTRAVENOUS | Status: DC
  Administered 2013-08-05: 08:00:00 via INTRAVENOUS

## 2013-08-05 MED ORDER — BUPIVACAINE HCL (PF) 0.5 % IJ SOLN
INTRAMUSCULAR | Status: DC | PRN
  Administered 2013-08-05: 9 mL

## 2013-08-05 MED ORDER — MIDAZOLAM HCL 2 MG/2ML IJ SOLN
1.0000 mg | INTRAMUSCULAR | Status: DC | PRN
  Administered 2013-08-05: 2 mg via INTRAVENOUS

## 2013-08-05 MED ORDER — LIDOCAINE HCL (CARDIAC) 20 MG/ML IV SOLN
INTRAVENOUS | Status: DC | PRN
  Administered 2013-08-05: 50 mg via INTRAVENOUS

## 2013-08-05 MED ORDER — DEXTROSE 5 % IV SOLN
2.0000 g | INTRAVENOUS | Status: AC
  Administered 2013-08-05: 2 g via INTRAVENOUS

## 2013-08-05 MED ORDER — ONDANSETRON HCL 4 MG/2ML IJ SOLN: 4.0000 mg | Freq: Once | INTRAMUSCULAR | Status: DC | PRN

## 2013-08-05 MED ORDER — OXYCODONE-ACETAMINOPHEN 7.5-325 MG PO TABS: 1.0000 | ORAL_TABLET | ORAL | Status: DC | PRN

## 2013-08-05 MED ORDER — ZOLPIDEM TARTRATE ER 6.25 MG PO TBCR: 6.2500 mg | EXTENDED_RELEASE_TABLET | Freq: Every evening | ORAL | Status: DC | PRN

## 2013-08-05 MED ORDER — ONDANSETRON HCL 4 MG/2ML IJ SOLN
INTRAMUSCULAR | Status: AC
  Filled 2013-08-05: qty 2

## 2013-08-05 MED ORDER — FENTANYL CITRATE 0.05 MG/ML IJ SOLN
INTRAMUSCULAR | Status: DC | PRN
  Administered 2013-08-05: 100 ug via INTRAVENOUS
  Administered 2013-08-05 (×3): 50 ug via INTRAVENOUS

## 2013-08-05 MED ORDER — MIDAZOLAM HCL 5 MG/5ML IJ SOLN
INTRAMUSCULAR | Status: DC | PRN
  Administered 2013-08-05: 2 mg via INTRAVENOUS

## 2013-08-05 MED ORDER — LACTATED RINGERS IV SOLN
INTRAVENOUS | Status: DC | PRN
  Administered 2013-08-05: 08:00:00 via INTRAVENOUS

## 2013-08-05 MED ORDER — GLYCOPYRROLATE 0.2 MG/ML IJ SOLN
0.2000 mg | Freq: Once | INTRAMUSCULAR | Status: AC
  Administered 2013-08-05: 0.2 mg via INTRAVENOUS

## 2013-08-05 MED ORDER — LIDOCAINE VISCOUS 2 % MT SOLN
OROMUCOSAL | Status: AC
  Filled 2013-08-05: qty 15

## 2013-08-05 MED ORDER — PROPOFOL 10 MG/ML IV BOLUS
INTRAVENOUS | Status: DC | PRN
  Administered 2013-08-05: 180 mg via INTRAVENOUS

## 2013-08-05 MED ORDER — PROPOFOL 10 MG/ML IV BOLUS
INTRAVENOUS | Status: AC
  Filled 2013-08-05: qty 20

## 2013-08-05 SURGICAL SUPPLY — 29 items
BAG HAMPER (MISCELLANEOUS) ×3 IMPLANT
CLOTH BEACON ORANGE TIMEOUT ST (SAFETY) ×3 IMPLANT
COVER LIGHT HANDLE STERIS (MISCELLANEOUS) ×6 IMPLANT
COVER MAYO STAND XLG (DRAPE) ×3 IMPLANT
DECANTER SPIKE VIAL GLASS SM (MISCELLANEOUS) ×3 IMPLANT
DRAPE PROXIMA HALF (DRAPES) ×3 IMPLANT
ELECT REM PT RETURN 9FT ADLT (ELECTROSURGICAL) ×3
ELECTRODE REM PT RTRN 9FT ADLT (ELECTROSURGICAL) ×1 IMPLANT
FORMALIN 10 PREFIL 120ML (MISCELLANEOUS) ×3 IMPLANT
GLOVE BIO SURGEON STRL SZ7.5 (GLOVE) ×3 IMPLANT
GLOVE BIOGEL PI IND STRL 7.5 (GLOVE) ×1 IMPLANT
GLOVE BIOGEL PI INDICATOR 7.5 (GLOVE) ×2
GLOVE ECLIPSE 7.0 STRL STRAW (GLOVE) ×3 IMPLANT
GLOVE EXAM NITRILE MD LF STRL (GLOVE) ×3 IMPLANT
GOWN STRL REUS W/TWL LRG LVL3 (GOWN DISPOSABLE) ×6 IMPLANT
HEMOSTAT SURGICEL 4X8 (HEMOSTASIS) ×3 IMPLANT
KIT ROOM TURNOVER AP CYSTO (KITS) ×3 IMPLANT
LIGASURE IMPACT 36 18CM CVD LR (INSTRUMENTS) ×3 IMPLANT
MANIFOLD NEPTUNE II (INSTRUMENTS) ×3 IMPLANT
NEEDLE HYPO 25X1 1.5 SAFETY (NEEDLE) ×3 IMPLANT
NS IRRIG 1000ML POUR BTL (IV SOLUTION) ×3 IMPLANT
PACK PERI GYN (CUSTOM PROCEDURE TRAY) ×3 IMPLANT
PAD ARMBOARD 7.5X6 YLW CONV (MISCELLANEOUS) ×3 IMPLANT
SET BASIN LINEN APH (SET/KITS/TRAYS/PACK) ×3 IMPLANT
SPONGE GAUZE 4X4 12PLY (GAUZE/BANDAGES/DRESSINGS) ×3 IMPLANT
SURGILUBE 3G PEEL PACK STRL (MISCELLANEOUS) ×3 IMPLANT
SUT SILK 0 FSL (SUTURE) ×3 IMPLANT
SUT VIC AB 2-0 CT2 27 (SUTURE) ×3 IMPLANT
SYR CONTROL 10ML LL (SYRINGE) ×3 IMPLANT

## 2013-08-05 NOTE — Transfer of Care (Signed)
Immediate Anesthesia Transfer of Care Note  Patient: Clinton Murphy  Procedure(s) Performed: Procedure(s): EXTENSIVE HEMORRHOIDECTOMY (N/A)  Patient Location: PACU  Anesthesia Type:General  Level of Consciousness: awake, alert , oriented and patient cooperative  Airway & Oxygen Therapy: Patient Spontanous Breathing and Patient connected to face mask oxygen  Post-op Assessment: Report given to PACU RN and Post -op Vital signs reviewed and stable  Post vital signs: Reviewed and stable  Complications: No apparent anesthesia complications

## 2013-08-05 NOTE — Discharge Instructions (Signed)
Hemorrhoidectomy °Care After °Hemorrhoidectomy is the removal of enlarged (dilated) veins around the rectum. Until the surgical areas are healed, control of pain and avoiding constipation are the greatest challenges for patients.  °For as long as 24 hours after receiving an anesthetic (the medication that made you sleep), and while taking narcotic pain relievers, you may feel dizzy, weak and drowsy. For that reason, the following information applies to the first 24-hour period following surgery, and continues for as long as you are taking narcotic pain medications. °· Do not drive a car, ride a bicycle, participate in activities in which you could be hurt. Do not take public transportation until you are off narcotic pain medications and until your caregiver says it is okay. °· Do not drink alcohol, take tranquilizers, or medications not prescribed or allowed by your surgical caregiver. °· Do not sign important papers or contracts for at least 24 hours or while taking narcotic medications. °· Have a responsible person with you for 24 hours. °RISKS AND COMPLICATIONS °Some problems that may occur following this procedure include: °· Infection. A germ starts growing in the tissue surrounding the site operated on. This can usually be treated with antibiotics. °· Damage to the rectal sphincter could occur. This is the muscle that opens in your anus to allow a bowel movement. This could cause incontinence. This is uncommon. °· Bleeding following surgery can be a complication of almost any surgery. Your surgeon takes every precaution to keep this from happening. °· Complications of anesthesia. °HOME CARE INSTRUCTIONS °· Avoid straining when having bowel movements. °· Avoid heavy lifting (more than 10 pounds (4.5 kilograms)). °· Only take over-the-counter or prescription medicines for pain, discomfort, or fever as directed by your caregiver. °· Take hot sitz baths for 20 to 30 minutes, 3 to 4 times per day. °· To keep  swelling down, apply an ice pack for twenty minutes three to four times per day between sitz baths. Use a towel between your skin and the ice pack. Do not do this if it causes too much discomfort. °· Keep anal area clean and dry. Following a bowel movement, you can gently wash the area with tucks (available for purchase at a drugstore) or cotton swabs. Gently pat the area dry. Do not rub the area. °· Eat a well balanced diet and drink 6 to 8 glasses of water every day to avoid constipation. A bulk laxative may be also be helpful. °SEEK MEDICAL CARE IF:  °· You have increasing pain or tenderness near or in the surgical site. °· You are unable to eat or drink. °· You develop nausea or vomiting. °· You develop uncontrolled bleeding such as soaking two to three pads in one hour. °· You have constipation, not helped by changing your diet or increasing your fluid intake. Pain medications are a common cause of constipation. °· You have pain and redness (inflammation) extending outside the area of your surgery. °· You develop an unexplained oral temperature above 102° F (38.9° C), or any other signs of infection. °· You have any other questions or concerns following surgery. °Document Released: 09/10/2003 Document Revised: 09/12/2011 Document Reviewed: 12/08/2008 °ExitCare® Patient Information ©2014 [employer] LLC. ° °

## 2013-08-05 NOTE — Anesthesia Preprocedure Evaluation (Signed)
Anesthesia Evaluation  Patient identified by MRN, date of birth, ID band Patient awake    Reviewed: Allergy & Precautions, H&P , NPO status , Patient's Chart, lab work & pertinent test results  Airway Mallampati: III TM Distance: >3 FB   Mouth opening: Limited Mouth Opening  Dental  (+) Poor Dentition, Missing and Loose   Pulmonary Current Smoker (am cough),  breath sounds clear to auscultation        Cardiovascular negative cardio ROS  Rhythm:Regular Rate:Normal     Neuro/Psych    GI/Hepatic negative GI ROS,   Endo/Other    Renal/GU      Musculoskeletal   Abdominal   Peds  Hematology   Anesthesia Other Findings   Reproductive/Obstetrics                           Anesthesia Physical Anesthesia Plan  ASA: II  Anesthesia Plan: General   Post-op Pain Management:    Induction: Intravenous  Airway Management Planned: LMA  Additional Equipment:   Intra-op Plan:   Post-operative Plan: Extubation in OR  Informed Consent: I have reviewed the patients History and Physical, chart, labs and discussed the procedure including the risks, benefits and alternatives for the proposed anesthesia with the patient or authorized representative who has indicated his/her understanding and acceptance.     Plan Discussed with:   Anesthesia Plan Comments:         Anesthesia Quick Evaluation

## 2013-08-05 NOTE — Anesthesia Postprocedure Evaluation (Signed)
  Anesthesia Post-op Note  Patient: Clinton RossettiJeffrey S Murphy  Procedure(s) Performed: Procedure(s): EXTENSIVE HEMORRHOIDECTOMY (N/A)  Patient Location: PACU  Anesthesia Type:General  Level of Consciousness: awake, alert , oriented and patient cooperative  Airway and Oxygen Therapy: Patient Spontanous Breathing and Patient connected to face mask oxygen  Post-op Pain: Mild  Post-op Assessment: Post-op Vital signs reviewed, Patient's Cardiovascular Status Stable, Respiratory Function Stable, Patent Airway, No signs of Nausea or vomiting and Pain level controlled  Post-op Vital Signs: Reviewed and stable  Complications: No apparent anesthesia complications

## 2013-08-05 NOTE — Anesthesia Procedure Notes (Addendum)
Procedure Name: LMA Insertion Performed by: Moshe SalisburyANIEL, Destynie Toomey E Pre-anesthesia Checklist: Patient identified, Patient being monitored, Emergency Drugs available, Timeout performed and Suction available Patient Re-evaluated:Patient Re-evaluated prior to inductionOxygen Delivery Method: Circle System Utilized Preoxygenation: Pre-oxygenation with 100% oxygen Intubation Type: IV induction Ventilation: Mask ventilation without difficulty LMA: LMA inserted LMA Size: 4.0 Number of attempts: 1 Placement Confirmation: positive ETCO2 and breath sounds checked- equal and bilateral Comments: Induction/LMA by A.Adams,CRNA, no problems

## 2013-08-05 NOTE — Interval H&P Note (Signed)
History and Physical Interval Note:  08/05/2013 8:00 AM  Clinton Murphy  has presented today for surgery, with the diagnosis of thrombosed hemorrhoid  The various methods of treatment have been discussed with the patient and family. After consideration of risks, benefits and other options for treatment, the patient has consented to  Procedure(s): EXTENSIVE HEMORRHOIDECTOMY (N/A) as a surgical intervention .  The patient's history has been reviewed, patient examined, no change in status, stable for surgery.  I have reviewed the patient's chart and labs.  Questions were answered to the patient's satisfaction.     Franky MachoJENKINS,[name]

## 2013-08-05 NOTE — Preoperative (Signed)
Beta Blockers   Reason not to administer Beta Blockers:Not Applicable 

## 2013-08-05 NOTE — Op Note (Signed)
Patient:  Clinton RossettiJeffrey S Murphy  DOB:  12/04/1970  MRN:  [id number]   Preop Diagnosis:  Internal and external hemorrhoidal disease  Postop Diagnosis:  Same  Procedure:  Extensive hemorrhoidectomy  Surgeon:  Franky MachoMark Marino Rogerson, M.D.  Anes:  General  Indications:  Patient is a 43 year old white male who presents with significant hemorrhoidal disease. As both internal and external hemorrhoidal disease. The risks and benefits of the procedure including bleeding, infection, and recurrence of the hemorrhoids were fully explained to the patient, who gave informed consent.  Procedure note:  The patient was placed in the lithotomy position after general anesthesia was administered. The perineum was prepped and draped using usual sterile technique with Betadine. Surgical site confirmation was performed.  On examination, the patient had a large internal and external hemorrhoid at the 8:00 position. He also had significant external hemorrhoidal disease from the 2 to 5:00 position. Other less significant hemorrhoidal disease was noted, though the patient had been told preoperatively that I could not get all the hemorrhoids and would only take care of those that were symptomatic. Using a LigaSure, the internal and external hemorrhoid at the o'clock position was excised. Likewise, the LigaSure was used to take care of the external hemorrhoids from the 2 to 5:00 position. A 2-0 Vicryl interrupted suture was used to reapproximate the mucosa. Care was taken to avoid the external sphincter mechanism. After the procedure, 2 fingers were easily able to be placed within the anus. The external sphincter was noted be intact. 0.5% Sensorcaine was instilled the surrounding wound. Surgicel and Viscous Xylocaine rectal packing was then placed.  All tape and needle counts were correct at the end of the procedure. The patient was awakened and transferred to PACU in stable condition.  Complications:  None  EBL:  Minimal  Specimen:   Hemorrhoids

## 2013-08-06 ENCOUNTER — Encounter (HOSPITAL_COMMUNITY): Payer: Self-pay | Admitting: General Surgery

## 2013-08-07 NOTE — ED Provider Notes (Signed)
Medical screening examination/treatment/procedure(s) were performed by non-physician practitioner and as supervising physician I was immediately available for consultation/collaboration.  EKG Interpretation   None       Devoria AlbeIva Chynna Buerkle, MD, Armando GangFACEP   Ward GivensIva L Akshitha Culmer, MD 08/07/13 [phone]

## 2015-02-07 ENCOUNTER — Encounter (HOSPITAL_COMMUNITY): Payer: Self-pay | Admitting: Emergency Medicine

## 2015-02-07 ENCOUNTER — Emergency Department (HOSPITAL_COMMUNITY)
Admission: EM | Admit: 2015-02-07 | Discharge: 2015-02-07 | Disposition: A | Discharge status: Home / Self Care | Payer: Medicare Other | Attending: Emergency Medicine | Admitting: Emergency Medicine

## 2015-02-07 DIAGNOSIS — G8929 Other chronic pain: Secondary | ICD-10-CM | POA: Insufficient documentation

## 2015-02-07 DIAGNOSIS — Z9889 Other specified postprocedural states: Secondary | ICD-10-CM | POA: Insufficient documentation

## 2015-02-07 DIAGNOSIS — Z720 Tobacco use: Secondary | ICD-10-CM | POA: Diagnosis not present

## 2015-02-07 DIAGNOSIS — M5441 Lumbago with sciatica, right side: Secondary | ICD-10-CM | POA: Insufficient documentation

## 2015-02-07 DIAGNOSIS — M549 Dorsalgia, unspecified: Secondary | ICD-10-CM | POA: Diagnosis present

## 2015-02-07 DIAGNOSIS — M5431 Sciatica, right side: Secondary | ICD-10-CM

## 2015-02-07 MED ORDER — OXYCODONE-ACETAMINOPHEN 5-325 MG PO TABS: 1.0000 | ORAL_TABLET | ORAL | Status: DC | PRN

## 2015-02-07 MED ORDER — OXYCODONE-ACETAMINOPHEN 5-325 MG PO TABS
2.0000 | ORAL_TABLET | Freq: Once | ORAL | Status: AC
  Administered 2015-02-07: 2 via ORAL
  Filled 2015-02-07: qty 2

## 2015-02-07 MED ORDER — NAPROXEN 500 MG PO TABS: 500.0000 mg | ORAL_TABLET | Freq: Two times a day (BID) | ORAL | Status: DC

## 2015-02-07 MED ORDER — CYCLOBENZAPRINE HCL 10 MG PO TABS: 10.0000 mg | ORAL_TABLET | Freq: Three times a day (TID) | ORAL | Status: DC | PRN

## 2015-02-07 NOTE — ED Notes (Signed)
Having pain to lower back radiating to right leg.  Rates pain 9/10.  History of back surgery with screws.
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
## 2015-02-07 NOTE — Discharge Instructions (Signed)
Sciatica °Sciatica is pain, weakness, numbness, or tingling along your sciatic nerve. The nerve starts in the lower back and runs down the back of each leg. Nerve damage or certain conditions pinch or put pressure on the sciatic nerve. This causes the pain, weakness, and other discomforts of sciatica. °HOME CARE  °· Only take medicine as told by your doctor. °· Apply ice to the affected area for 20 minutes. Do this 3-4 times a day for the first 48-72 hours. Then try heat in the same way. °· Exercise, stretch, or do your usual activities if these do not make your pain worse. °· Go to physical therapy as told by your doctor. °· Keep all doctor visits as told. °· Do not wear high heels or shoes that are not supportive. °· Get a firm mattress if your mattress is too soft to lessen pain and discomfort. °GET HELP RIGHT AWAY IF:  °· You cannot control when you poop (bowel movement) or pee (urinate). °· You have more weakness in your lower back, lower belly (pelvis), butt (buttocks), or legs. °· You have redness or puffiness (swelling) of your back. °· You have a burning feeling when you pee. °· You have pain that gets worse when you lie down. °· You have pain that wakes you from your sleep. °· Your pain is worse than past pain. °· Your pain lasts longer than 4 weeks. °· You are suddenly losing weight without reason. °MAKE SURE YOU:  °· Understand these instructions. °· Will watch this condition. °· Will get help right away if you are not doing well or get worse. °Document Released: 03/29/2008 Document Revised: 12/20/2011 Document Reviewed: 10/30/2011 °ExitCare® Patient Information ©2015 [employer]. This information is not intended to replace advice given to you by your health care provider. Make sure you discuss any questions you have with your health care provider. ° °

## 2015-03-13 ENCOUNTER — Emergency Department (HOSPITAL_COMMUNITY): Payer: No Typology Code available for payment source

## 2015-03-13 ENCOUNTER — Emergency Department (HOSPITAL_COMMUNITY)
Admission: EM | Admit: 2015-03-13 | Discharge: 2015-03-13 | Disposition: A | Discharge status: Home / Self Care | Payer: No Typology Code available for payment source | Attending: Emergency Medicine | Admitting: Emergency Medicine

## 2015-03-13 ENCOUNTER — Encounter (HOSPITAL_COMMUNITY): Payer: Self-pay | Admitting: Emergency Medicine

## 2015-03-13 DIAGNOSIS — S3992XA Unspecified injury of lower back, initial encounter: Secondary | ICD-10-CM | POA: Diagnosis not present

## 2015-03-13 DIAGNOSIS — Z791 Long term (current) use of non-steroidal anti-inflammatories (NSAID): Secondary | ICD-10-CM | POA: Insufficient documentation

## 2015-03-13 DIAGNOSIS — Y998 Other external cause status: Secondary | ICD-10-CM | POA: Diagnosis not present

## 2015-03-13 DIAGNOSIS — M549 Dorsalgia, unspecified: Secondary | ICD-10-CM

## 2015-03-13 DIAGNOSIS — G8929 Other chronic pain: Secondary | ICD-10-CM | POA: Insufficient documentation

## 2015-03-13 DIAGNOSIS — Y9389 Activity, other specified: Secondary | ICD-10-CM | POA: Insufficient documentation

## 2015-03-13 DIAGNOSIS — Z720 Tobacco use: Secondary | ICD-10-CM | POA: Diagnosis not present

## 2015-03-13 DIAGNOSIS — Y92410 Unspecified street and highway as the place of occurrence of the external cause: Secondary | ICD-10-CM | POA: Diagnosis not present

## 2015-03-13 DIAGNOSIS — S199XXA Unspecified injury of neck, initial encounter: Secondary | ICD-10-CM | POA: Diagnosis not present

## 2015-03-13 DIAGNOSIS — S79912A Unspecified injury of left hip, initial encounter: Secondary | ICD-10-CM | POA: Diagnosis not present

## 2015-03-13 MED ORDER — HYDROCODONE-ACETAMINOPHEN 5-325 MG PO TABS: 2.0000 | ORAL_TABLET | ORAL | Status: DC | PRN

## 2015-03-13 MED ORDER — HYDROCODONE-ACETAMINOPHEN 5-325 MG PO TABS
1.0000 | ORAL_TABLET | Freq: Once | ORAL | Status: AC
  Administered 2015-03-13: 1 via ORAL
  Filled 2015-03-13: qty 1

## 2015-03-13 NOTE — ED Notes (Signed)
Patient transported to CT 
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
## 2015-03-13 NOTE — ED Notes (Signed)
Patient transported to X-ray 
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
## 2016-05-29 ENCOUNTER — Encounter (HOSPITAL_COMMUNITY): Payer: Self-pay | Admitting: Emergency Medicine

## 2016-05-29 ENCOUNTER — Emergency Department (HOSPITAL_COMMUNITY)
Admission: EM | Admit: 2016-05-29 | Discharge: 2016-05-29 | Disposition: A | Discharge status: Home / Self Care | Payer: Medicare Other | Attending: Emergency Medicine | Admitting: Emergency Medicine

## 2016-05-29 DIAGNOSIS — F17210 Nicotine dependence, cigarettes, uncomplicated: Secondary | ICD-10-CM | POA: Insufficient documentation

## 2016-05-29 DIAGNOSIS — Z791 Long term (current) use of non-steroidal anti-inflammatories (NSAID): Secondary | ICD-10-CM | POA: Diagnosis not present

## 2016-05-29 DIAGNOSIS — K439 Ventral hernia without obstruction or gangrene: Secondary | ICD-10-CM | POA: Insufficient documentation

## 2016-05-29 DIAGNOSIS — R1010 Upper abdominal pain, unspecified: Secondary | ICD-10-CM

## 2016-05-29 DIAGNOSIS — R1033 Periumbilical pain: Secondary | ICD-10-CM | POA: Diagnosis present

## 2016-05-29 DIAGNOSIS — R197 Diarrhea, unspecified: Secondary | ICD-10-CM | POA: Diagnosis not present

## 2016-05-29 HISTORY — DX: Umbilical hernia without obstruction or gangrene: K42.9

## 2016-05-29 LAB — CBC WITH DIFFERENTIAL/PLATELET
BASOS ABS: 0.1 10*3/uL (ref 0.0–0.1)
Basophils Relative: 1 %
Eosinophils Absolute: 0.4 10*3/uL (ref 0.0–0.7)
Eosinophils Relative: 3 %
HEMATOCRIT: 43.9 % (ref 39.0–52.0)
Hemoglobin: 14.9 g/dL (ref 13.0–17.0)
LYMPHS PCT: 53 %
Lymphs Abs: 5.6 10*3/uL — ABNORMAL HIGH (ref 0.7–4.0)
MCH: 28.8 pg (ref 26.0–34.0)
MCHC: 33.9 g/dL (ref 30.0–36.0)
MCV: 84.7 fL (ref 78.0–100.0)
MONO ABS: 0.9 10*3/uL (ref 0.1–1.0)
Monocytes Relative: 8 %
NEUTROS ABS: 3.8 10*3/uL (ref 1.7–7.7)
Neutrophils Relative %: 35 %
Platelets: 214 10*3/uL (ref 150–400)
RBC: 5.18 MIL/uL (ref 4.22–5.81)
RDW: 14.3 % (ref 11.5–15.5)
WBC: 10.8 10*3/uL — AB (ref 4.0–10.5)

## 2016-05-29 LAB — COMPREHENSIVE METABOLIC PANEL
ALT: 31 U/L (ref 17–63)
AST: 22 U/L (ref 15–41)
Albumin: 4.3 g/dL (ref 3.5–5.0)
Alkaline Phosphatase: 81 U/L (ref 38–126)
Anion gap: 6 (ref 5–15)
BUN: 23 mg/dL — AB (ref 6–20)
CHLORIDE: 106 mmol/L (ref 101–111)
CO2: 24 mmol/L (ref 22–32)
Calcium: 9.5 mg/dL (ref 8.9–10.3)
Creatinine, Ser: 0.9 mg/dL (ref 0.61–1.24)
GFR calc Af Amer: >60 mL/min (ref >60)
Glucose, Bld: 107 mg/dL — ABNORMAL HIGH (ref 65–99)
POTASSIUM: 3.5 mmol/L (ref 3.5–5.1)
Sodium: 136 mmol/L (ref 135–145)
Total Bilirubin: 0.3 mg/dL (ref 0.3–1.2)
Total Protein: 6.9 g/dL (ref 6.5–8.1)

## 2016-05-29 LAB — LIPASE, BLOOD: LIPASE: 49 U/L (ref 11–51)

## 2016-05-29 MED ORDER — ONDANSETRON 8 MG PO TBDP: 8.0000 mg | ORAL_TABLET | Freq: Three times a day (TID) | ORAL | 0 refills | Status: AC | PRN

## 2016-05-29 MED ORDER — FAMOTIDINE 20 MG PO TABS
20.0000 mg | ORAL_TABLET | Freq: Two times a day (BID) | ORAL | 0 refills | Status: AC
Start: 2016-05-29 — End: ?

## 2016-05-29 MED ORDER — SODIUM CHLORIDE 0.9 % IV BOLUS (SEPSIS)
1000.0000 mL | Freq: Once | INTRAVENOUS | Status: AC
  Administered 2016-05-29: 1000 mL via INTRAVENOUS

## 2016-05-29 MED ORDER — PANTOPRAZOLE SODIUM 40 MG PO TBEC
40.0000 mg | DELAYED_RELEASE_TABLET | Freq: Every day | ORAL | 0 refills | Status: AC
Start: 2016-05-29 — End: ?

## 2016-05-29 NOTE — ED Triage Notes (Signed)
Pt states he thinks his "hernia is acting up" because he is having pain at site and acid reflux.
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
## 2018-05-12 ENCOUNTER — Emergency Department (HOSPITAL_COMMUNITY)
Admission: EM | Admit: 2018-05-12 | Discharge: 2018-05-12 | Disposition: A | Discharge status: Home / Self Care | Payer: Medicare Other | Attending: Emergency Medicine | Admitting: Emergency Medicine

## 2018-05-12 ENCOUNTER — Encounter (HOSPITAL_COMMUNITY): Payer: Self-pay | Admitting: Emergency Medicine

## 2018-05-12 ENCOUNTER — Other Ambulatory Visit: Payer: Self-pay

## 2018-05-12 DIAGNOSIS — R1031 Right lower quadrant pain: Secondary | ICD-10-CM | POA: Diagnosis present

## 2018-05-12 DIAGNOSIS — Z5321 Procedure and treatment not carried out due to patient leaving prior to being seen by health care provider: Secondary | ICD-10-CM | POA: Insufficient documentation

## 2018-05-12 LAB — URINALYSIS, ROUTINE W REFLEX MICROSCOPIC
Bilirubin Urine: NEGATIVE
Glucose, UA: NEGATIVE mg/dL
Hgb urine dipstick: NEGATIVE
KETONES UR: NEGATIVE mg/dL
LEUKOCYTES UA: NEGATIVE
NITRITE: NEGATIVE
PH: 6 (ref 5.0–8.0)
Protein, ur: NEGATIVE mg/dL
Specific Gravity, Urine: 1.018 (ref 1.005–1.030)

## 2018-05-12 NOTE — ED Notes (Signed)
Received call from registration stating patient and family were leaving and he was going to see PCP on Monday.

## 2018-05-12 NOTE — ED Triage Notes (Signed)
Pt c/o RT sided groin pain x 2 days. Denies testicular swelling. Denies urinary symptoms.

## 2021-06-23 ENCOUNTER — Emergency Department (HOSPITAL_COMMUNITY)
Admission: EM | Admit: 2021-06-23 | Discharge: 2021-06-23 | Disposition: A | Discharge status: Home / Self Care | Payer: Medicare Other | Attending: Emergency Medicine | Admitting: Emergency Medicine

## 2021-06-23 DIAGNOSIS — M79604 Pain in right leg: Secondary | ICD-10-CM | POA: Insufficient documentation

## 2021-06-23 DIAGNOSIS — F17210 Nicotine dependence, cigarettes, uncomplicated: Secondary | ICD-10-CM | POA: Insufficient documentation

## 2021-06-23 DIAGNOSIS — M5416 Radiculopathy, lumbar region: Secondary | ICD-10-CM | POA: Diagnosis not present

## 2021-06-23 DIAGNOSIS — G8929 Other chronic pain: Secondary | ICD-10-CM | POA: Insufficient documentation

## 2021-06-23 MED ORDER — GABAPENTIN 300 MG PO CAPS: 300.0000 mg | ORAL_CAPSULE | Freq: Three times a day (TID) | ORAL | 0 refills | Status: DC

## 2021-06-23 MED ORDER — KETOROLAC TROMETHAMINE 30 MG/ML IJ SOLN
30.0000 mg | Freq: Once | INTRAMUSCULAR | Status: AC
Start: 2021-06-23 — End: 2021-06-23
  Administered 2021-06-23: 01:00:00 30 mg via INTRAMUSCULAR
  Filled 2021-06-23: qty 1

## 2021-06-23 MED ORDER — GABAPENTIN 300 MG PO CAPS: 300.0000 mg | ORAL_CAPSULE | Freq: Three times a day (TID) | ORAL | 0 refills | Status: AC

## 2021-06-23 NOTE — Discharge Instructions (Signed)
Keep your appointment with Dr. Shon Baton.  Take medications as prescribed.  Return for fever, inability to walk, or incontinence.
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
## 2021-06-23 NOTE — ED Triage Notes (Signed)
Pt c/o back pain, R leg pain "for awhile." States it "got so bad to where I'd cry," back hurts worse when laying flat. Hx multiple back surgeries. Denies incontinence, cane at baseline

## 2021-06-25 ENCOUNTER — Emergency Department (HOSPITAL_COMMUNITY)
Admission: EM | Admit: 2021-06-25 | Discharge: 2021-06-26 | Disposition: A | Discharge status: Home / Self Care | Payer: Medicare Other | Attending: Emergency Medicine | Admitting: Emergency Medicine

## 2021-06-25 ENCOUNTER — Other Ambulatory Visit: Payer: Self-pay

## 2021-06-25 ENCOUNTER — Emergency Department (HOSPITAL_COMMUNITY): Payer: Medicare Other

## 2021-06-25 DIAGNOSIS — M545 Low back pain, unspecified: Secondary | ICD-10-CM | POA: Diagnosis present

## 2021-06-25 DIAGNOSIS — M5417 Radiculopathy, lumbosacral region: Secondary | ICD-10-CM | POA: Insufficient documentation

## 2021-06-25 DIAGNOSIS — F17210 Nicotine dependence, cigarettes, uncomplicated: Secondary | ICD-10-CM | POA: Diagnosis not present

## 2021-06-25 MED ORDER — KETOROLAC TROMETHAMINE 30 MG/ML IJ SOLN
15.0000 mg | Freq: Once | INTRAMUSCULAR | Status: AC
  Administered 2021-06-25: 20:00:00 15 mg via INTRAMUSCULAR
  Filled 2021-06-25: qty 1

## 2021-06-25 MED ORDER — KETOROLAC TROMETHAMINE 60 MG/2ML IM SOLN
15.0000 mg | Freq: Once | INTRAMUSCULAR | Status: DC
  Filled 2021-06-25: qty 2

## 2021-06-25 NOTE — ED Provider Notes (Signed)
Emergency Medicine Provider Triage Evaluation Note  DHRUVAN GULLION , a 50 y.o. male  was evaluated in triage.  Pt complains of ongoing chronic back pain with acute worsening. Patient endorses persistent pain in right thigh, sensation of having smacked his shin on a truck on the right. Patient endorses extreme decrease in mobility over the last several days. Hx of lumbar surgery. Denies saddle anesthesia. Has pain clinic norco, valium, gabapentin, tylenol for pain, with minimal relief.  Review of Systems  Positive: As above Negative: As above  Physical Exam  BP (!) 179/126 (BP Location: Right Arm)    Pulse (!) 118    Temp 98.1 F (36.7 C) (Oral)    Resp 20    SpO2 98%  Gen:   Awake, no distress   Resp:  Normal effort  MSK:   Moves extremities without difficulty, intact strenght with some pain of bil LE Other:  TTP lumbar paraspinous muscles. Intact dp/pt pulses BIL  Medical Decision Making  Medically screening exam initiated at 7:52 PM.  Appropriate orders placed.  Takao Lizer Roudebush was informed that the remainder of the evaluation will be completed by another provider, this initial triage assessment does not replace that evaluation, and the importance of remaining in the ED until their evaluation is complete.  Back pain   [place] 06/25/21 1955    Eber Hong, MD 06/25/21 2303

## 2021-06-25 NOTE — ED Triage Notes (Signed)
Pt here for lower R back pain that shoots down his R leg. Pt has hx of sciatica, states this feels similar, but is now having difficulty walking on R leg. Pt also reports pain in R shin, states he feels like he's constantly banging it against something.

## 2021-06-26 DIAGNOSIS — M5417 Radiculopathy, lumbosacral region: Secondary | ICD-10-CM | POA: Diagnosis not present

## 2021-06-26 MED ORDER — DEXAMETHASONE 4 MG PO TABS: 4.0000 mg | ORAL_TABLET | ORAL | 0 refills | Status: AC | PRN

## 2021-06-26 MED ORDER — DEXAMETHASONE 4 MG PO TABS
6.0000 mg | ORAL_TABLET | Freq: Once | ORAL | Status: AC
Start: 2021-06-26 — End: 2021-06-26
  Administered 2021-06-26: 02:00:00 6 mg via ORAL
  Filled 2021-06-26: qty 2

## 2021-06-26 NOTE — ED Notes (Signed)
Patient is resting comfortably. Waiting to be seen by provider
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
## 2021-07-03 ENCOUNTER — Other Ambulatory Visit: Payer: Self-pay

## 2021-07-03 ENCOUNTER — Emergency Department (HOSPITAL_COMMUNITY): Payer: Medicare Other

## 2021-07-03 ENCOUNTER — Emergency Department (HOSPITAL_COMMUNITY)
Admission: EM | Admit: 2021-07-03 | Discharge: 2021-07-05 | Disposition: A | Discharge status: Other Acute Inpatient Hospital | Payer: Medicare Other | Attending: Emergency Medicine | Admitting: Emergency Medicine

## 2021-07-03 DIAGNOSIS — F29 Unspecified psychosis not due to a substance or known physiological condition: Secondary | ICD-10-CM | POA: Diagnosis not present

## 2021-07-03 DIAGNOSIS — F17210 Nicotine dependence, cigarettes, uncomplicated: Secondary | ICD-10-CM | POA: Insufficient documentation

## 2021-07-03 DIAGNOSIS — Z79899 Other long term (current) drug therapy: Secondary | ICD-10-CM | POA: Diagnosis not present

## 2021-07-03 DIAGNOSIS — D72829 Elevated white blood cell count, unspecified: Secondary | ICD-10-CM | POA: Insufficient documentation

## 2021-07-03 DIAGNOSIS — R4182 Altered mental status, unspecified: Secondary | ICD-10-CM | POA: Diagnosis present

## 2021-07-03 DIAGNOSIS — F19950 Other psychoactive substance use, unspecified with psychoactive substance-induced psychotic disorder with delusions: Secondary | ICD-10-CM | POA: Diagnosis present

## 2021-07-03 DIAGNOSIS — Z20822 Contact with and (suspected) exposure to covid-19: Secondary | ICD-10-CM | POA: Insufficient documentation

## 2021-07-03 DIAGNOSIS — F19251 Other psychoactive substance dependence with psychoactive substance-induced psychotic disorder with hallucinations: Secondary | ICD-10-CM | POA: Insufficient documentation

## 2021-07-03 LAB — CBC WITH DIFFERENTIAL/PLATELET
Abs Immature Granulocytes: 0.18 10*3/uL — ABNORMAL HIGH (ref 0.00–0.07)
Basophils Absolute: 0.1 10*3/uL (ref 0.0–0.1)
Basophils Relative: 0 %
Eosinophils Absolute: 0.1 10*3/uL (ref 0.0–0.5)
Eosinophils Relative: 1 %
HCT: 41.8 % (ref 39.0–52.0)
Hemoglobin: 14 g/dL (ref 13.0–17.0)
Immature Granulocytes: 1 %
Lymphocytes Relative: 26 %
Lymphs Abs: 4.3 10*3/uL — ABNORMAL HIGH (ref 0.7–4.0)
MCH: 28.5 pg (ref 26.0–34.0)
MCHC: 33.5 g/dL (ref 30.0–36.0)
MCV: 85 fL (ref 80.0–100.0)
Monocytes Absolute: 1.7 10*3/uL — ABNORMAL HIGH (ref 0.1–1.0)
Monocytes Relative: 11 %
Neutro Abs: 9.9 10*3/uL — ABNORMAL HIGH (ref 1.7–7.7)
Neutrophils Relative %: 61 %
Platelets: 274 10*3/uL (ref 150–400)
RBC: 4.92 MIL/uL (ref 4.22–5.81)
RDW: 13.7 % (ref 11.5–15.5)
WBC: 16.3 10*3/uL — ABNORMAL HIGH (ref 4.0–10.5)
nRBC: 0 % (ref 0.0–0.2)

## 2021-07-03 LAB — I-STAT CHEM 8, ED
BUN: 33 mg/dL — ABNORMAL HIGH (ref 6–20)
Calcium, Ion: 1.24 mmol/L (ref 1.15–1.40)
Chloride: 103 mmol/L (ref 98–111)
Creatinine, Ser: 0.9 mg/dL (ref 0.61–1.24)
Glucose, Bld: 109 mg/dL — ABNORMAL HIGH (ref 70–99)
HCT: 47 % (ref 39.0–52.0)
Hemoglobin: 16 g/dL (ref 13.0–17.0)
Potassium: 3.7 mmol/L (ref 3.5–5.1)
Sodium: 137 mmol/L (ref 135–145)
TCO2: 24 mmol/L (ref 22–32)

## 2021-07-03 LAB — URINALYSIS, ROUTINE W REFLEX MICROSCOPIC
Bilirubin Urine: NEGATIVE
Glucose, UA: NEGATIVE mg/dL
Hgb urine dipstick: NEGATIVE
Ketones, ur: NEGATIVE mg/dL
Leukocytes,Ua: NEGATIVE
Nitrite: NEGATIVE
Protein, ur: NEGATIVE mg/dL
Specific Gravity, Urine: 1.018 (ref 1.005–1.030)
pH: 5 (ref 5.0–8.0)

## 2021-07-03 LAB — ETHANOL: Alcohol, Ethyl (B): <10 mg/dL (ref <10)

## 2021-07-03 LAB — TSH: TSH: 2.839 u[IU]/mL (ref 0.350–4.500)

## 2021-07-03 LAB — COMPREHENSIVE METABOLIC PANEL
ALT: 41 U/L (ref 0–44)
AST: 30 U/L (ref 15–41)
Albumin: 4.4 g/dL (ref 3.5–5.0)
Alkaline Phosphatase: 62 U/L (ref 38–126)
Anion gap: 9 (ref 5–15)
BUN: 33 mg/dL — ABNORMAL HIGH (ref 6–20)
CO2: 23 mmol/L (ref 22–32)
Calcium: 9.1 mg/dL (ref 8.9–10.3)
Chloride: 102 mmol/L (ref 98–111)
Creatinine, Ser: 0.81 mg/dL (ref 0.61–1.24)
GFR, Estimated: >60 mL/min (ref >60)
Glucose, Bld: 110 mg/dL — ABNORMAL HIGH (ref 70–99)
Potassium: 3.4 mmol/L — ABNORMAL LOW (ref 3.5–5.1)
Sodium: 134 mmol/L — ABNORMAL LOW (ref 135–145)
Total Bilirubin: 0.7 mg/dL (ref 0.3–1.2)
Total Protein: 7.2 g/dL (ref 6.5–8.1)

## 2021-07-03 LAB — RAPID URINE DRUG SCREEN, HOSP PERFORMED
Amphetamines: NOT DETECTED
Barbiturates: NOT DETECTED
Benzodiazepines: POSITIVE — AB
Cocaine: NOT DETECTED
Opiates: POSITIVE — AB
Tetrahydrocannabinol: NOT DETECTED

## 2021-07-03 LAB — CBG MONITORING, ED: Glucose-Capillary: 104 mg/dL — ABNORMAL HIGH (ref 70–99)

## 2021-07-03 LAB — LACTIC ACID, PLASMA
Lactic Acid, Venous: 0.9 mmol/L (ref 0.5–1.9)
Lactic Acid, Venous: 1.4 mmol/L (ref 0.5–1.9)

## 2021-07-03 LAB — SALICYLATE LEVEL: Salicylate Lvl: <7 mg/dL — ABNORMAL LOW (ref 7.0–30.0)

## 2021-07-03 LAB — ACETAMINOPHEN LEVEL: Acetaminophen (Tylenol), Serum: <10 ug/mL — ABNORMAL LOW (ref 10–30)

## 2021-07-03 MED ORDER — STERILE WATER FOR INJECTION IJ SOLN
INTRAMUSCULAR | Status: AC
  Administered 2021-07-03: 1.2 mL
  Filled 2021-07-03: qty 10

## 2021-07-03 MED ORDER — PANTOPRAZOLE SODIUM 40 MG PO TBEC
40.0000 mg | DELAYED_RELEASE_TABLET | Freq: Every day | ORAL | Status: DC
  Administered 2021-07-03: 40 mg via ORAL
  Filled 2021-07-03: qty 1

## 2021-07-03 MED ORDER — LISINOPRIL 10 MG PO TABS: 20.0000 mg | ORAL_TABLET | Freq: Every day | ORAL | Status: DC

## 2021-07-03 MED ORDER — LORAZEPAM 2 MG/ML IJ SOLN
INTRAMUSCULAR | Status: AC
  Administered 2021-07-03: 2 mg via INTRAVENOUS
  Filled 2021-07-03: qty 1

## 2021-07-03 MED ORDER — ZIPRASIDONE MESYLATE 20 MG IM SOLR
20.0000 mg | Freq: Once | INTRAMUSCULAR | Status: AC
  Administered 2021-07-03: 20 mg via INTRAMUSCULAR
  Filled 2021-07-03 (×2): qty 20

## 2021-07-03 MED ORDER — LORAZEPAM 2 MG/ML IJ SOLN: 2.0000 mg | Freq: Once | INTRAMUSCULAR | Status: AC

## 2021-07-03 NOTE — ED Notes (Addendum)
Pt continuously walking outside of room and telling staff about "needing to save someone". This nurse educated patient on importance of staying in room for his safety and other patients safety. Pt removed cardiac monitoring, BP cuff, and pulse ox. PA aware.
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
## 2021-07-03 NOTE — ED Notes (Signed)
All pt clothes, belongings and shoes given to spouse to take home- spouse has left to go home. Pt to bathroom and back to room escorted by sitter. Pt allowed this nurse to administer medication and dressed out into purple scrubs with assistance of spouse prior to her leaving. Room secured. Pt has been wanded 2nd time by security.

## 2021-07-03 NOTE — ED Notes (Signed)
Pt in room, pt having conversation in room-no one is in room- sitter at bedside- says pt is just "rambling about religious stuff"

## 2021-07-03 NOTE — ED Notes (Signed)
TTS complete, pt drowsy during assessment, minimal participation in conversation, Ala Dach -counselor at [facility] reports he will call pt wife for collateral information.

## 2021-07-03 NOTE — ED Notes (Signed)
Security called to wand pt, pt is currently not dressed out, IV paperwork has been notarized and being faxed at this time, anticipate pt will attempt to leave when asked to dress out, pt spouse at bedside right now.
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
## 2021-07-03 NOTE — ED Notes (Signed)
TTS cart placed in room for psych assessment-pt resting with eyes closed at this time.

## 2021-07-03 NOTE — ED Notes (Signed)
Wife at bedside at this time.

## 2021-07-03 NOTE — ED Notes (Addendum)
Pt just woke up- pt asking for water, pt given cup of water.  Security made aware that pt is awake.

## 2021-07-03 NOTE — ED Notes (Signed)
Pt ambulatory to bathroom and back to room, escorted by security.

## 2021-07-03 NOTE — ED Notes (Signed)
Sec. At bedside. Pt want return to the bed

## 2021-07-03 NOTE — ED Notes (Signed)
Spoke with pt spouse, Clinton Murphy who called to check on pt- updated on pt status and plan of care. Spouse says she will be back up here in the morning.

## 2021-07-03 NOTE — ED Notes (Signed)
Pt noted to cough up green mucous.

## 2021-07-03 NOTE — ED Notes (Signed)
IVC paperwork faxed and in effect at this time. Carney Bern, [employer] officer at pt door, pt walking out trying to leave, pt verbalizing religious grandiosity conversation. Pt Redirected back into room with help of spouse and [employer]- pt continues to walk out of room saying he wants to leave- will administer Geodon, spouse is aware and at bedside.

## 2021-07-03 NOTE — ED Notes (Signed)
Patient transported to CT. Will obtain EKG once pt returns.

## 2021-07-03 NOTE — BH Assessment (Signed)
Comprehensive Clinical Assessment (CCA) Note  07/03/2021 Clinton Murphy VO:[id number]  DISPOSITION: Gave clinical report to Clinton Reasoner, NP who recommended Pt be observed overnight and evaluated by psychiatry in the morning. Notified Dr. Noemi Murphy and Clinton Salm, RN of recommendation via secure message.  The patient demonstrates the following risk factors for suicide: Chronic risk factors for suicide include: chronic pain. Acute risk factors for suicide include: N/A. Protective factors for this patient include: positive social support, positive therapeutic relationship, responsibility to others (children, family), hope for the future, and religious beliefs against suicide. Considering these factors, the overall suicide risk at this point appears to be low. Patient is appropriate for outpatient follow up.  [place] ED from 07/03/2021 in [place] ED from 06/25/2021 in [place] [facility] from 06/23/2021 in [place] No Risk No Risk No Risk      Pt is a 50 year old married male who presents unaccompanied to [facility] ED due to altered mental status. ED record indicates Pt is hyper-verbal, religiously preoccupied, confused, and repeatedly saying "saving on person with Jesus." Pt is also reported to have said, "I didn't mind staying up for 3 nights because I found what I was scared of. It was a black thing with no eyes." He was reported to be continuously walking outside of room and telling staff about "needing to save someone". He was placed under involuntary commitment.  During assessment, Pt is dressed in hospital scrubs and drowsy. Pt speaks in a mumbled tone, at low volume and slow pace. Motor behavior appears slowed. Eye contact is minimal. He was repeatedly roused by the RN but was only able to state his name and date of birth. He was unable to answer any other  questions.   TTS contacted Pt's wife, Clinton Murphy at [phone] for collateral information. She says Pt has been on disability due to spinal injury, L3 and L4, for over 20 years. She states on Thanksgiving he injured his back by picking an object off the floor. She says he was in excruciating pain and saw his orthopedic doctor. He later saw his pain specialist and his primary care physician. She says he has recently been prescribed medications including Flexeril, Gabapentin, and Decadron. She reports he has been tearful recently due to chronic pain. She states that he has not slept at all in three days and approximately 48 hours ago he began "talking out of his head" and not making sense. She describes Pt as hyper-verbal and religiously preoccupied and talking about seeing Jesus, seeing the devil, and needing to save people. She says he has never exhibited this behavior before. She says to her knowledge he has no history of psychiatric treatment. She states he has not made any reference to not wanting to live or suicide. She says he has not been physically aggressive. She says he rarely drinks alcohol and does not use illicit substances. She says she, their 87 year old daughter, and 19 year old son are very concerned about his mental state.  Chief Complaint:  Chief Complaint  Patient presents with   Altered Mental Status   Visit Diagnosis: F29 Unspecified psychotic disorder   CCA Screening, Triage and Referral (STR)  Patient Reported Information How did you hear about [place]? Family/Friend  What Is the Reason for Your Visit/Call Today? Pt presents with AMS for the past 48 hours. Pt's wife reports Pt is taking new prescription medications for severe back  pain and over the past 48 hours "has been talking out of his head." She says he has not slept in 48 hours. Per ED record, Pt has been religiously preoccupied, hyperverbal, confused, and tried to leave the ED. Pt's wife reports he has no psychiatric  history and has never exhibited this behavior in the past. Pt appears drowsy during assessment and was unable to answer questions.  How Long Has This Been Causing You Problems? <Week  What Do You Feel Would Help You the Most Today? Medication(s)   Have You Recently Had Any Thoughts About Hurting Yourself? -- (Unable to assess, Pt not responding to questions.)  Are You Planning to Commit Suicide/Harm Yourself At This time? -- (Unable to assess, Pt not responding to questions.)   Have you Recently Had Thoughts About Hurting Someone Else? -- (Unable to assess, Pt not responding to questions.)  Are You Planning to Harm Someone at This Time? -- (Unable to assess, Pt not responding to questions.)  Explanation: No data recorded  Have You Used Any Alcohol or Drugs in the Past 24 Hours? -- (Unable to assess, Pt not responding to questions.)  How Long Ago Did You Use Drugs or Alcohol? No data recorded What Did You Use and How Much? No data recorded  Do You Currently Have a Therapist/Psychiatrist? No  Name of Therapist/Psychiatrist: No data recorded  Have You Been Recently Discharged From Any Office Practice or Programs? No  Explanation of Discharge From Practice/Program: No data recorded    CCA Screening Triage Referral Assessment Type of Contact: Tele-Assessment  Telemedicine Service Delivery: Telemedicine service delivery: This service was provided via telemedicine using a 2-way, interactive audio and video technology  Is this Initial or Reassessment? Initial Assessment  Date Telepsych consult ordered in [facility]:  07/03/21  Time Telepsych consult ordered in [facility]:  1714  Location of Assessment: [facility]  Provider Location: [facility] Assessment Services   Collateral Involvement: Pt's wife: Clinton Murphy [phone]   Does Patient Have a Court Appointed Legal Guardian? No data recorded Name and Contact of Legal Guardian: No data recorded If Minor and Not Living with Parent(s), Who has  Custody? NA  Is CPS involved or ever been involved? Never  Is APS involved or ever been involved? Never   Patient Determined To Be At Risk for Harm To Self or Others Based on Review of Patient Reported Information or Presenting Complaint? Yes, for Self-Harm  Method: No data recorded Availability of Means: No data recorded Intent: No data recorded Notification Required: No data recorded Additional Information for Danger to Others Potential: No data recorded Additional Comments for Danger to Others Potential: No data recorded Are There Guns or Other Weapons in Your Home? No data recorded Types of Guns/Weapons: No data recorded Are These Weapons Safely Secured?                            No data recorded Who Could Verify You Are Able To Have These Secured: No data recorded Do You Have any Outstanding Charges, Pending Court Dates, Parole/Probation? No data recorded Contacted To Inform of Risk of Harm To Self or Others: Family/Significant Other:    Does Patient Present under Involuntary Commitment? 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)   Patient Currently Receiving the Following Services: Not Receiving Services   Determination of Need: Emergent (2 hours)   Options For Referral: Inpatient Hospitalization;  Medication Management     CCA Biopsychosocial Patient Reported Schizophrenia/Schizoaffective Diagnosis in Past: No   Strengths: Pt has good family support   Mental Health Symptoms Depression:   Change in energy/activity; Difficulty Concentrating; Fatigue; Sleep (too much or little); Increase/decrease in appetite   Duration of Depressive symptoms:  Duration of Depressive Symptoms: Less than two weeks   Mania:   Change in energy/activity; Increased Energy   Anxiety:    Difficulty concentrating; Restlessness; Tension; Sleep; Worrying   Psychosis:   Grossly disorganized speech; Delusions   Duration of  Psychotic symptoms:  Duration of Psychotic Symptoms: Less than six months   Trauma:   -- (Unable to assess, Pt not responding to questions.)   Obsessions:   -- (Unable to assess, Pt not responding to questions.)   Compulsions:   -- (Unable to assess, Pt not responding to questions.)   Inattention:   -- (Unable to assess, Pt not responding to questions.)   Hyperactivity/Impulsivity:   -- (Unable to assess, Pt not responding to questions.)   Oppositional/Defiant Behaviors:  No data recorded  Emotional Irregularity:   -- (Unable to assess, Pt not responding to questions.)   Other Mood/Personality Symptoms:  No data recorded   Mental Status Exam Appearance and self-care  Stature:   Average   Weight:   Average weight   Clothing:   -- (Scrubs)   Grooming:   Normal   Cosmetic use:   None   Posture/gait:   Normal   Motor activity:   Not Remarkable   Sensorium  Attention:   Inattentive   Concentration:   Scattered   Orientation:   Person   Recall/memory:   -- (Unable to assess, Pt not responding to questions.)   Affect and Mood  Affect:   -- (Unable to assess, Pt not responding to questions.)   Mood:   -- (Unable to assess, Pt not responding to questions.)   Relating  Eye contact:   Fleeting   Facial expression:   Constricted   Attitude toward examiner:   Uninterested   Thought and Language  Speech flow:  Slurred   Thought content:   -- (Unable to assess, Pt not responding to questions.)   Preoccupation:   Religion   Hallucinations:   -- (Unable to assess, Pt not responding to questions.)   Organization:  No data recorded  [employer] of Knowledge:   Average   Intelligence:   Average   Abstraction:   -- (Unable to assess, Pt not responding to questions.)   Judgement:   -- (Unable to assess, Pt not responding to questions.)   Reality Testing:   Distorted   Insight:   Poor   Decision Making:   Confused    Social Functioning  Social Maturity:   Responsible   Social Judgement:   Normal   Stress  Stressors:   Illness   Coping Ability:   Exhausted; Overwhelmed   Skill Deficits:   -- (Unable to assess, Pt not responding to questions.)   Supports:   Family     Religion: Religion/Spirituality Are You A Religious Person?: Yes What is Your Religious Affiliation?: Christian How Might This Affect Treatment?: Pt appears religiously preoccupied  Leisure/Recreation: Leisure / Recreation Do You Have Hobbies?:  (Unable to assess, Pt not responding to questions.)  Exercise/Diet: Exercise/Diet Do You Exercise?:  (Unable to assess, Pt not responding to questions.) Have You Gained or Lost A Significant Amount of Weight in the Past Six Months?:  (Unable  to assess, Pt not responding to questions.) Do You Follow a Special Diet?:  (Unable to assess, Pt not responding to questions.) Do You Have Any Trouble Sleeping?: Yes Explanation of Sleeping Difficulties: Wife reports Pt has not slept in 48 hours   CCA Employment/Education Employment/Work Situation: Employment / Work Situation Employment Situation: On disability Why is Patient on Disability: Spinal injury, damage to L3, L4 How Long has Patient Been on Disability: over 20 years Patient's Job has Been Impacted by Current Illness: No Has Patient ever Been in the [employer]?:  (Unable to assess, Pt not responding to questions.)  Education: Education Is Patient Currently Attending School?: No Did Physicist, medical?:  (Unable to assess, Pt not responding to questions.) Did You Have An Individualized Education Program (IIEP):  (Unable to assess, Pt not responding to questions.) Did You Have Any Difficulty At School?:  (Unable to assess, Pt not responding to questions.) Patient's Education Has Been Impacted by Current Illness:  (Unable to assess, Pt not responding to questions.)   CCA Family/Childhood History Family and Relationship  History: Family history Marital status: Married Does patient have children?: Yes How many children?: 2 How is patient's relationship with their children?: Pt has 64 year old daughter and 24 year old son  Childhood History:  Childhood History By whom was/is the patient raised?:  (Unable to assess, Pt not responding to questions.) Did patient suffer any verbal/emotional/physical/sexual abuse as a child?:  (Unable to assess, Pt not responding to questions.) Did patient suffer from severe childhood neglect?:  (Unable to assess, Pt not responding to questions.) Has patient ever been sexually abused/assaulted/raped as an adolescent or adult?:  (Unable to assess, Pt not responding to questions.) Was the patient ever a victim of a crime or a disaster?:  (Unable to assess, Pt not responding to questions.) Witnessed domestic violence?:  (Unable to assess, Pt not responding to questions.) Has patient been affected by domestic violence as an adult?:  (Unable to assess, Pt not responding to questions.)  Child/Adolescent Assessment:     CCA Substance Use Alcohol/Drug Use:                           ASAM's:  Six Dimensions of Multidimensional Assessment  Dimension 1:  Acute Intoxication and/or Withdrawal Potential:      Dimension 2:  Biomedical Conditions and Complications:      Dimension 3:  Emotional, Behavioral, or Cognitive Conditions and Complications:     Dimension 4:  Readiness to Change:     Dimension 5:  Relapse, Continued use, or Continued Problem Potential:     Dimension 6:  Recovery/Living Environment:     ASAM Severity Score:    ASAM Recommended Level of Treatment:     Substance use Disorder (SUD)    Recommendations for Services/Supports/Treatments:    Discharge Disposition:    DSM5 Diagnoses: There are no problems to display for this patient.    Referrals to Alternative Service(s): Referred to Alternative Service(s):   Place:   Date:   Time:    Referred  to Alternative Service(s):   Place:   Date:   Time:    Referred to Alternative Service(s):   Place:   Date:   Time:    Referred to Alternative Service(s):   Place:   Date:   Time:     Evelena Peat, [facility]

## 2021-07-03 NOTE — ED Triage Notes (Signed)
Pt arrives via [facility] for altered mental status. Per wife at home, pt was recently started on decadron and gabapentin for chronic back pain. Pt has not slept in three days, non-compliant with medications. On arrival pt is hypervocal, with religious grandiosity. Continues to repeat the same phrases about "saving one person with Jesus".

## 2021-07-03 NOTE — ED Notes (Signed)
Pt refusing vitals at this time.

## 2021-07-03 NOTE — ED Notes (Signed)
Pt continues to walk out of room. Pt stated "the devil will try to trick you like he is trying to trick me right now." This nurse redirected pt back into room.

## 2021-07-04 ENCOUNTER — Encounter (HOSPITAL_COMMUNITY): Payer: Self-pay | Admitting: Registered Nurse

## 2021-07-04 DIAGNOSIS — F29 Unspecified psychosis not due to a substance or known physiological condition: Secondary | ICD-10-CM | POA: Diagnosis not present

## 2021-07-04 DIAGNOSIS — F17210 Nicotine dependence, cigarettes, uncomplicated: Secondary | ICD-10-CM | POA: Diagnosis not present

## 2021-07-04 DIAGNOSIS — F19950 Other psychoactive substance use, unspecified with psychoactive substance-induced psychotic disorder with delusions: Secondary | ICD-10-CM | POA: Diagnosis not present

## 2021-07-04 DIAGNOSIS — Z20822 Contact with and (suspected) exposure to covid-19: Secondary | ICD-10-CM | POA: Diagnosis not present

## 2021-07-04 DIAGNOSIS — D72829 Elevated white blood cell count, unspecified: Secondary | ICD-10-CM | POA: Diagnosis not present

## 2021-07-04 DIAGNOSIS — F19251 Other psychoactive substance dependence with psychoactive substance-induced psychotic disorder with hallucinations: Secondary | ICD-10-CM | POA: Diagnosis not present

## 2021-07-04 DIAGNOSIS — Z79899 Other long term (current) drug therapy: Secondary | ICD-10-CM | POA: Diagnosis not present

## 2021-07-04 DIAGNOSIS — R4182 Altered mental status, unspecified: Secondary | ICD-10-CM | POA: Diagnosis present

## 2021-07-04 LAB — RESP PANEL BY RT-PCR (FLU A&B, COVID) ARPGX2
Influenza A by PCR: NEGATIVE
Influenza B by PCR: NEGATIVE
SARS Coronavirus 2 by RT PCR: NEGATIVE

## 2021-07-04 MED ORDER — LORAZEPAM 2 MG/ML IJ SOLN
2.0000 mg | Freq: Once | INTRAMUSCULAR | Status: AC
  Administered 2021-07-04: 2 mg via INTRAMUSCULAR
  Filled 2021-07-04: qty 1

## 2021-07-04 MED ORDER — ZIPRASIDONE MESYLATE 20 MG IM SOLR
20.0000 mg | Freq: Once | INTRAMUSCULAR | Status: AC
Start: 2021-07-04 — End: 2021-07-04
  Administered 2021-07-04: 20 mg via INTRAMUSCULAR
  Filled 2021-07-04: qty 20

## 2021-07-04 MED ORDER — ZIPRASIDONE MESYLATE 20 MG IM SOLR
10.0000 mg | Freq: Once | INTRAMUSCULAR | Status: AC
  Administered 2021-07-04: 10 mg via INTRAMUSCULAR
  Filled 2021-07-04: qty 20

## 2021-07-04 MED ORDER — OLANZAPINE 5 MG PO TABS
2.5000 mg | ORAL_TABLET | ORAL | Status: DC
Start: 2021-07-04 — End: 2021-07-05
  Administered 2021-07-05: 2.5 mg via ORAL
  Filled 2021-07-04 (×2): qty 1

## 2021-07-04 MED ORDER — LORAZEPAM 1 MG PO TABS: 2.0000 mg | ORAL_TABLET | ORAL | Status: DC

## 2021-07-04 MED ORDER — OLANZAPINE 5 MG PO TBDP: 5.0000 mg | ORAL_TABLET | Freq: Every day | ORAL | Status: DC

## 2021-07-04 MED ORDER — NICOTINE 14 MG/24HR TD PT24
14.0000 mg | MEDICATED_PATCH | Freq: Once | TRANSDERMAL | Status: AC
  Administered 2021-07-04: 14 mg via TRANSDERMAL
  Filled 2021-07-04: qty 1

## 2021-07-04 NOTE — ED Notes (Signed)
TTS machine placed at bedside.   

## 2021-07-04 NOTE — ED Notes (Signed)
This nurse held phone while pt said goodnight to spouse, pt in bed at this time, encouraged pt to get some rest- pt calm and cooperative with this nurse.

## 2021-07-04 NOTE — ED Notes (Signed)
TTS consult in process. 

## 2021-07-04 NOTE — ED Notes (Signed)
Pt asked for crackers and water

## 2021-07-04 NOTE — ED Notes (Signed)
[facility] made aware of negative Covid result and asked that it be faxed.

## 2021-07-04 NOTE — ED Notes (Signed)
Pt continues to try to stand outside of room. Dr. Hyacinth Meeker notified. 20mg  of Geodon given as verbal order.

## 2021-07-04 NOTE — Progress Notes (Signed)
Per Denice Bors Rankins,NP, patient meets criteria for inpatient treatment. There are no available or appropriate beds at [facility] today. CSW faxed referrals to the following facilities for review:  [facility]  Pending - No Request Sent N/A [address], [place] [address] [phone] --  [facility]  Pending - No Request Sent N/A [address], [place] [address] [phone] --  [facility]  Pending - No Request Sent N/A [address] [place] [address] [phone] --  [facility]  Pending - No Request Sent N/A [address] [place], [place] [address] [phone] --  [facility]  Pending - No Request Sent N/A [address] [place] [address] [phone] --  [facility]  Pending - No Request Sent N/A [address] [place] [address] [phone] --  [facility]  Pending - No Request Sent N/A [address] [place] [address] [phone] --  [facility]  Pending - No Request Sent N/A [address] [place] [address] [phone] --  [facility]  Pending - No Request Sent N/A [address], [place] [address] [phone] --  [facility]  Pending - No Request Sent N/A [address] [place] [place] [address] [phone] --  [facility]  Pending - No Request Sent N/A [address] [place] [address] [phone] --  [facility]  Pending - No Request Sent N/A [address] [place] [address] [phone] --   TTS will continue to seek bed placement.  Crissie Reese, MSW, LCSW-A, LCAS-A Phone: [phone] Disposition/TOC

## 2021-07-04 NOTE — ED Notes (Signed)
Pt is standing outside of room, asked to stay in room and pt says I'm not God and continues to stand outside of room door. Security notified.

## 2021-07-04 NOTE — Consult Note (Signed)
Clinton Murphy  Spoke to patients wife Fouad Fambrough at [phone].  An update on patients status given.  Informed that medications was started that should help with the psychosis.  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  He has been recommended for inpatient psychiatric treatment but may be better before an appropriate bed is found.    Jakolby Sedivy B. Farris Geiman, NP

## 2021-07-04 NOTE — ED Notes (Signed)
Pt increasingly agitated and continues to verbally harass sitter. Security notified. [employer] and security at bedside.

## 2021-07-04 NOTE — ED Notes (Signed)
This nurse let pt borrow personal Bible as the hospital does not have any. Pt given a piece of paper and crayon. Pt currently writing on paper and reading bible. Encouraged pt to sleep, pt says he is scared to go to sleep. Pt tearful at times, but cooperative with this nurse.

## 2021-07-04 NOTE — ED Notes (Signed)
Pt standing in room- writing on paper with crayon
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
## 2021-07-04 NOTE — ED Notes (Signed)
Received call from Ron at [facility] whom states that patient may arrive to their facility in the am after 0800 pending negative Covid swab. Accepting MD is Dr. Estill Cotta. Call Covid results to [phone], call report in am to [phone] to [phone]. SW at [facility] sent secure message to make aware. Primary RN aware.

## 2021-07-04 NOTE — Progress Notes (Signed)
Per Ron, Admissions, pt has been accepted to [facility], main campus. Accepting provider is Dr. Estill Cotta. Patient can arrive 07/05/2021 after 8:00am. Number for report is [phone]) [phone]. Please fax IVC paperwork  before calling report to fax#: [phone]   Crissie Reese, MSW, LCSW-A Phone: [phone] Disposition/TOC

## 2021-07-04 NOTE — ED Notes (Signed)
Pt has not had any rest during the night. Pt makes frequent trips to the restroom. Pt wanting to "save" everyone with God. .  Pt tearful this am.

## 2021-07-04 NOTE — ED Notes (Signed)
When transporting pt in room 16 to the 3rd floor. This pt came out of room and grabbled onto other pt's arm and attempting to "bless her." Multiple RNs and security needed to redirect pt.

## 2021-07-04 NOTE — ED Notes (Signed)
Pt refusing daily medications stating that "it was agreed that I don't take no meds until my wife gets here. You see that was another trick. I don't need those meds, I don't care if I die." Pt continuous to talk to self, sitter, and other patients. Hyper focused on religion and "saving others."

## 2021-07-05 DIAGNOSIS — F29 Unspecified psychosis not due to a substance or known physiological condition: Secondary | ICD-10-CM | POA: Diagnosis not present

## 2021-07-05 DIAGNOSIS — F19251 Other psychoactive substance dependence with psychoactive substance-induced psychotic disorder with hallucinations: Secondary | ICD-10-CM | POA: Diagnosis not present

## 2021-07-05 DIAGNOSIS — F19950 Other psychoactive substance use, unspecified with psychoactive substance-induced psychotic disorder with delusions: Secondary | ICD-10-CM | POA: Diagnosis not present

## 2021-07-05 DIAGNOSIS — Z79899 Other long term (current) drug therapy: Secondary | ICD-10-CM | POA: Diagnosis not present

## 2021-07-05 DIAGNOSIS — Z20822 Contact with and (suspected) exposure to covid-19: Secondary | ICD-10-CM | POA: Diagnosis not present

## 2021-07-05 DIAGNOSIS — R4182 Altered mental status, unspecified: Secondary | ICD-10-CM | POA: Diagnosis present

## 2021-07-05 DIAGNOSIS — F17210 Nicotine dependence, cigarettes, uncomplicated: Secondary | ICD-10-CM | POA: Diagnosis not present

## 2021-07-05 DIAGNOSIS — D72829 Elevated white blood cell count, unspecified: Secondary | ICD-10-CM | POA: Diagnosis not present

## 2021-07-05 MED ORDER — OLANZAPINE 5 MG PO TBDP
10.0000 mg | ORAL_TABLET | Freq: Three times a day (TID) | ORAL | Status: DC | PRN
  Filled 2021-07-05: qty 2

## 2021-07-05 MED ORDER — STERILE WATER FOR INJECTION IJ SOLN
INTRAMUSCULAR | Status: AC
  Filled 2021-07-05: qty 10

## 2021-07-05 MED ORDER — LORAZEPAM 1 MG PO TABS: 1.0000 mg | ORAL_TABLET | ORAL | Status: DC | PRN

## 2021-07-05 MED ORDER — ZIPRASIDONE MESYLATE 20 MG IM SOLR
20.0000 mg | INTRAMUSCULAR | Status: AC | PRN
  Administered 2021-07-05: 20 mg via INTRAMUSCULAR
  Filled 2021-07-05: qty 20

## 2021-07-05 NOTE — ED Notes (Addendum)
Pt becoming agitated, trying to walk out of room. Pt yelling at staff. This RN attempted to give oral medication, pt refused stating "you want me to be God, I am not God. I need someone to wash my feet and print me pages from the Bible"   MD made aware.

## 2021-07-05 NOTE — ED Notes (Signed)
Pt is asleep at this time, will reassess vitals once pt is awake.

## 2021-07-05 NOTE — ED Notes (Signed)
Pt ambulated to restroom. 

## 2021-07-05 NOTE — ED Notes (Signed)
Spoke to wife and updated her on status / plan for transfer to [place]. Wife appreciative and confirms that she has all pts belongings at home

## 2021-07-05 NOTE — ED Notes (Signed)
An update on patients status given to wife, Littleton Haub.

## 2023-08-20 IMAGING — CT CT HEAD W/O CM
3 of 4 series · 16 of 47 positions shown, 19 images · non-contrast
Comparison: February 19, 2004

CLINICAL DATA: Mental status change.

EXAM:
CT HEAD WITHOUT CONTRAST
TECHNIQUE: Contiguous axial images were obtained from the base of the skull
through the vertex without intravenous contrast.

[Series 3: head w o · axial · 0.44mm/px · z∈[+95,+215]mm · 10 of 30 slices shown, 13 images]
[im 3/30  brain]
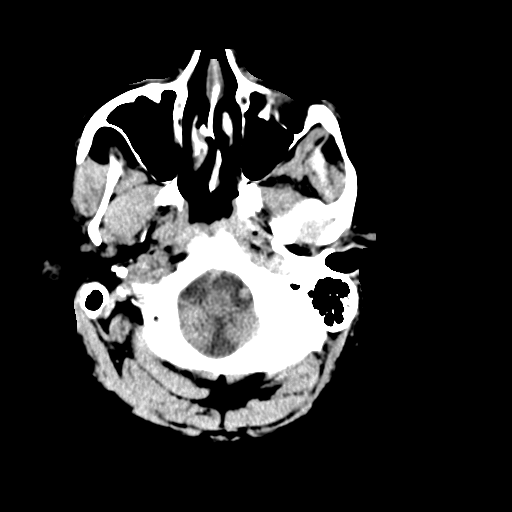
[im 3/30  bone]
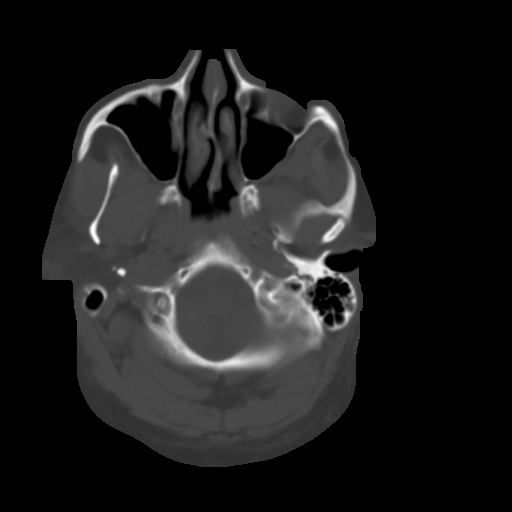
[im 5/30  brain]
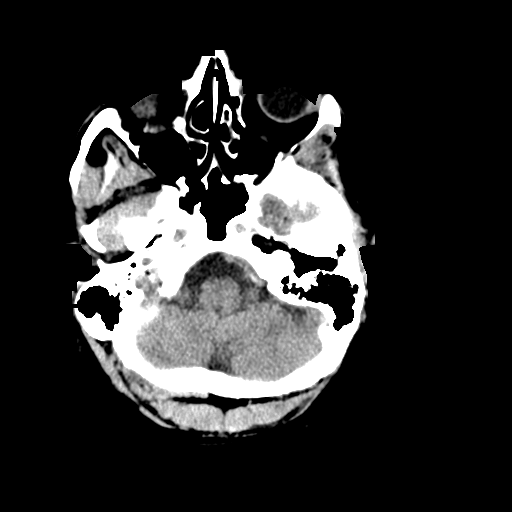
[im 9/30  brain]
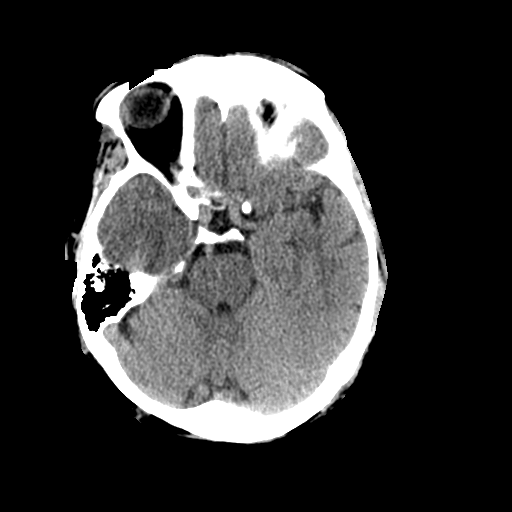
[im 11/30  brain]
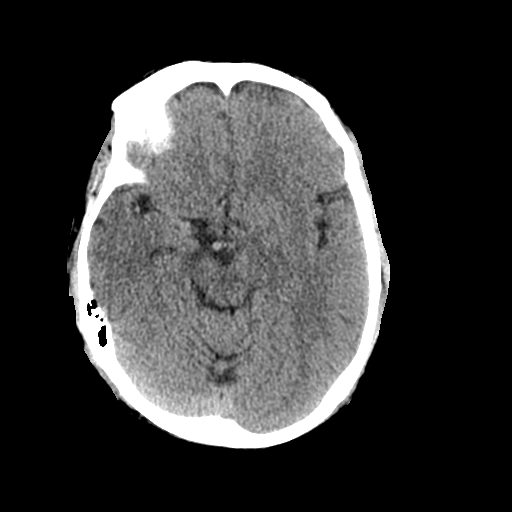
[im 13/30  brain]
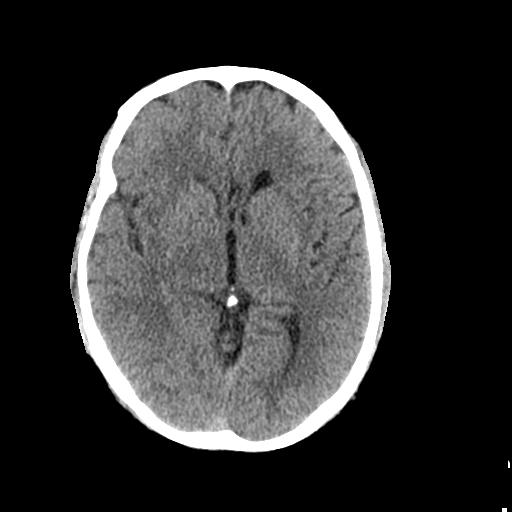
[im 13/30  bone]
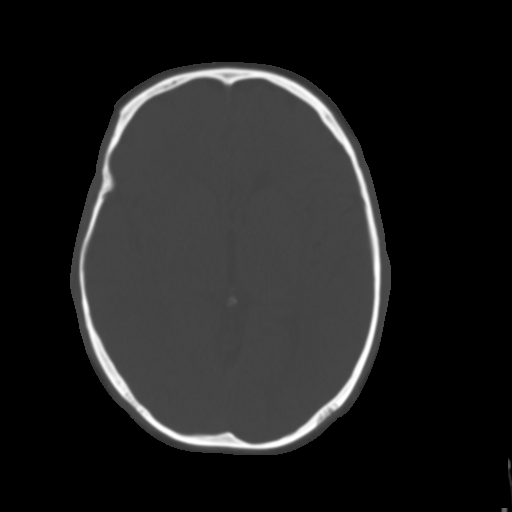
[im 17/30  brain]
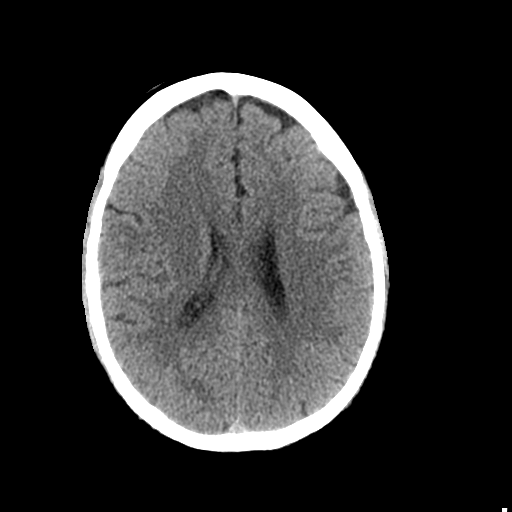
[im 19/30  brain]
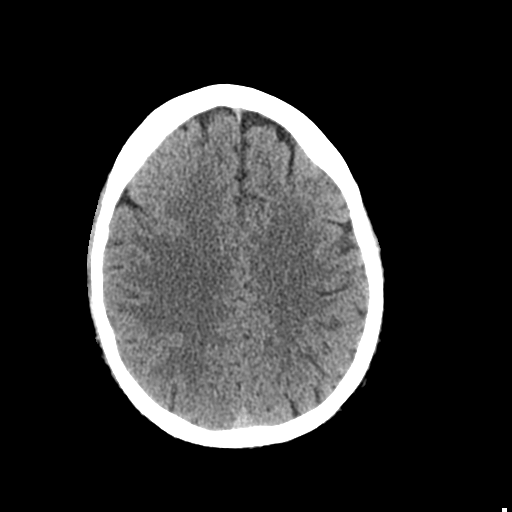
[im 21/30  brain]
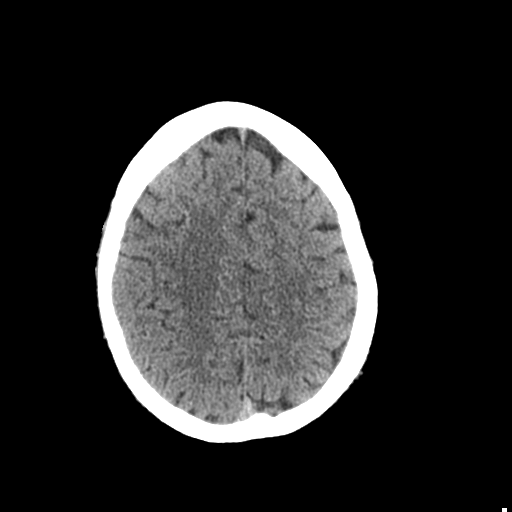
[im 25/30  brain]
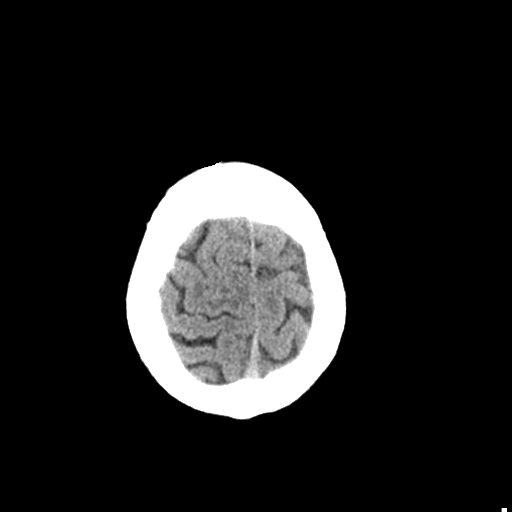
[im 25/30  bone]
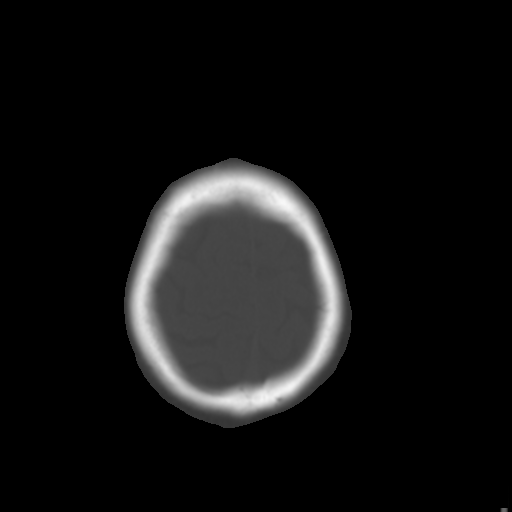
[im 27/30  brain]
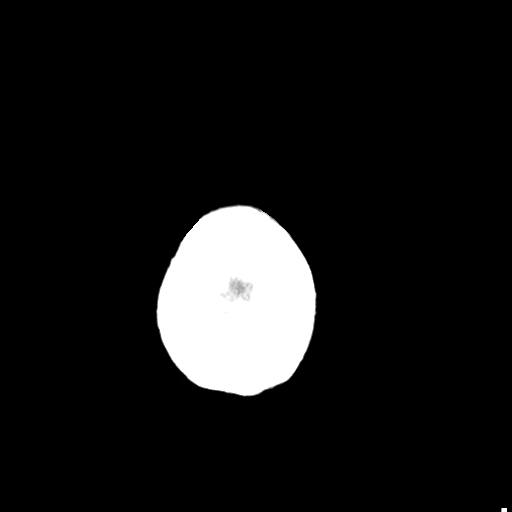

[Series 4: coronal soft · coronal · 0.34mm/px · 3 of 70 slices shown]
[im 24/70  brain]
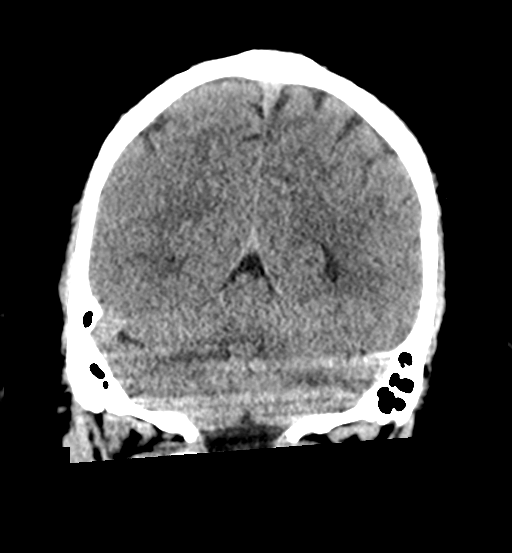
[im 31/70  brain]
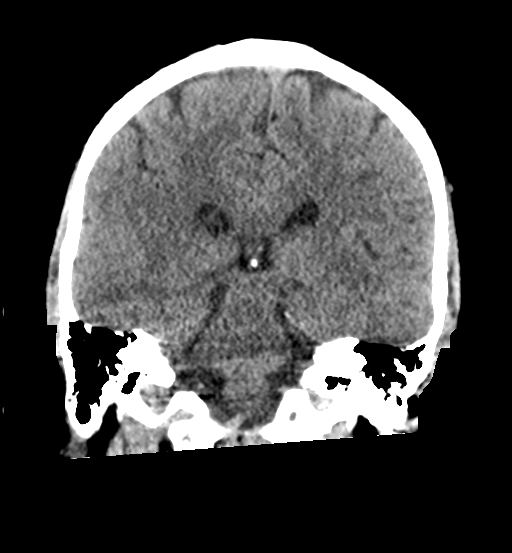
[im 39/70  brain]
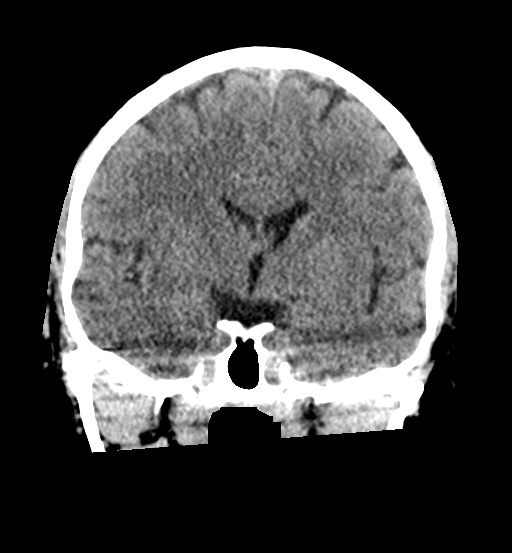

[Series 5: sagittal soft · sagittal · 0.35mm/px · 3 of 59 slices shown]
[im 20/59  brain]
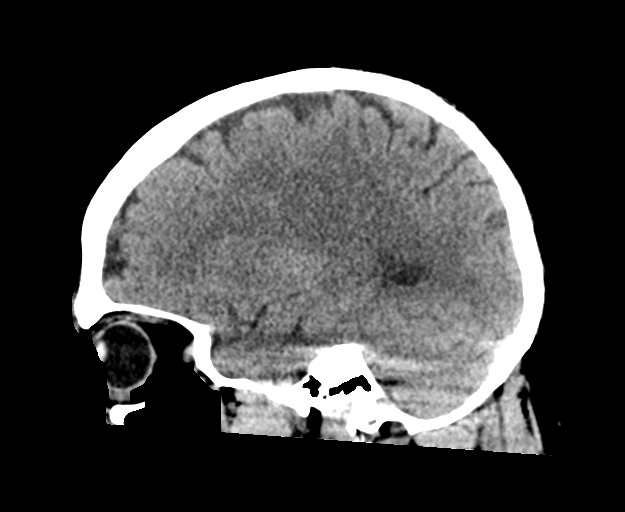
[im 30/59  brain]
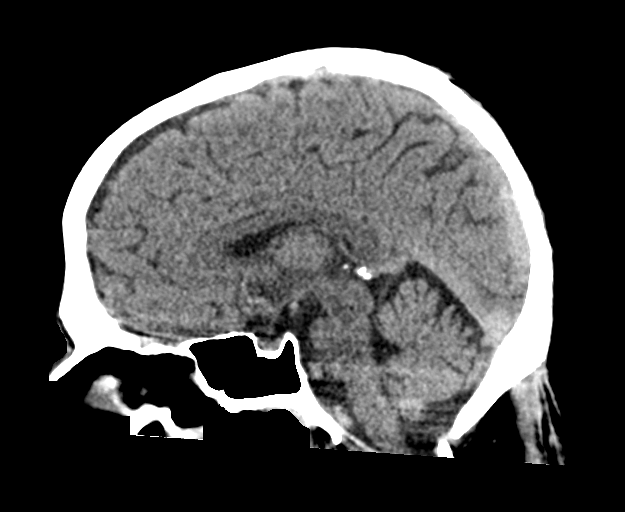
[im 39/59  brain]
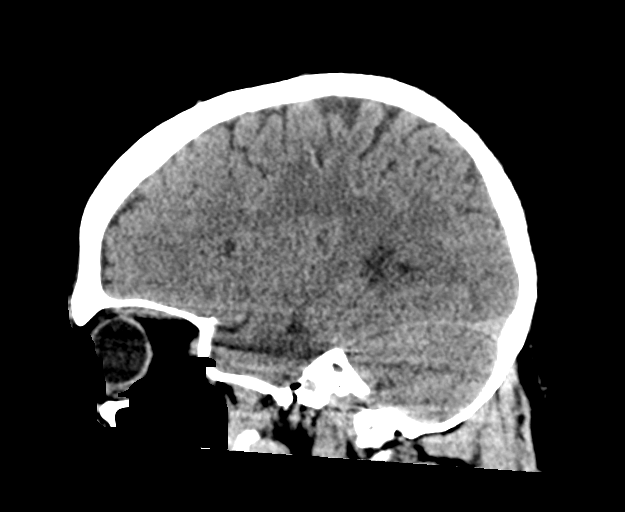

[16 of 47 positions shown; findings below may reference images not displayed]

FINDINGS: Brain: No evidence of acute infarction, hemorrhage, hydrocephalus,
extra-axial collection or mass lesion/mass effect.

Vascular: No hyperdense vessel is noted.

Skull: Normal. Negative for fracture or focal lesion.

Sinuses/Orbits: There is mild mucoperiosteal thickening of the right
maxillary sinus. The orbits are normal.

Other: None.
IMPRESSION: No focal acute intracranial abnormality identified.

## 2023-10-23 ENCOUNTER — Other Ambulatory Visit: Payer: Self-pay

## 2023-10-23 ENCOUNTER — Emergency Department (HOSPITAL_COMMUNITY)

## 2023-10-23 ENCOUNTER — Emergency Department (HOSPITAL_COMMUNITY)
Admission: EM | Admit: 2023-10-23 | Discharge: 2023-10-24 | Disposition: A | Discharge status: Home / Self Care | Attending: Emergency Medicine | Admitting: Emergency Medicine

## 2023-10-23 ENCOUNTER — Encounter (HOSPITAL_COMMUNITY): Payer: Self-pay | Admitting: *Deleted

## 2023-10-23 DIAGNOSIS — K6389 Other specified diseases of intestine: Secondary | ICD-10-CM

## 2023-10-23 DIAGNOSIS — R1032 Left lower quadrant pain: Secondary | ICD-10-CM | POA: Diagnosis present

## 2023-10-23 DIAGNOSIS — K659 Peritonitis, unspecified: Secondary | ICD-10-CM | POA: Insufficient documentation

## 2023-10-23 HISTORY — DX: Essential (primary) hypertension: I10

## 2023-10-23 LAB — CBC
HCT: 47.3 % (ref 39.0–52.0)
Hemoglobin: 15.9 g/dL (ref 13.0–17.0)
MCH: 28.8 pg (ref 26.0–34.0)
MCHC: 33.6 g/dL (ref 30.0–36.0)
MCV: 85.7 fL (ref 80.0–100.0)
Platelets: 227 10*3/uL (ref 150–400)
RBC: 5.52 MIL/uL (ref 4.22–5.81)
RDW: 13.8 % (ref 11.5–15.5)
WBC: 9.8 10*3/uL (ref 4.0–10.5)
nRBC: 0 % (ref 0.0–0.2)

## 2023-10-23 LAB — COMPREHENSIVE METABOLIC PANEL WITH GFR
ALT: 36 U/L (ref 0–44)
AST: 30 U/L (ref 15–41)
Albumin: 4.3 g/dL (ref 3.5–5.0)
Alkaline Phosphatase: 83 U/L (ref 38–126)
Anion gap: 12 (ref 5–15)
BUN: 14 mg/dL (ref 6–20)
CO2: 21 mmol/L — ABNORMAL LOW (ref 22–32)
Calcium: 9.6 mg/dL (ref 8.9–10.3)
Chloride: 103 mmol/L (ref 98–111)
Creatinine, Ser: 0.92 mg/dL (ref 0.61–1.24)
GFR, Estimated: >60 mL/min (ref >60)
Glucose, Bld: 144 mg/dL — ABNORMAL HIGH (ref 70–99)
Potassium: 3.7 mmol/L (ref 3.5–5.1)
Sodium: 136 mmol/L (ref 135–145)
Total Bilirubin: 0.5 mg/dL (ref 0.0–1.2)
Total Protein: 7.5 g/dL (ref 6.5–8.1)

## 2023-10-23 LAB — LIPASE, BLOOD: Lipase: 47 U/L (ref 11–51)

## 2023-10-23 MED ORDER — PIPERACILLIN-TAZOBACTAM 3.375 G IVPB 30 MIN
3.3750 g | Freq: Once | INTRAVENOUS | Status: AC
  Administered 2023-10-24: 3.375 g via INTRAVENOUS
  Filled 2023-10-23: qty 50

## 2023-10-23 MED ORDER — DOCUSATE SODIUM 100 MG PO CAPS
100.0000 mg | ORAL_CAPSULE | Freq: Once | ORAL | Status: AC
  Administered 2023-10-24: 100 mg via ORAL
  Filled 2023-10-23: qty 1

## 2023-10-23 MED ORDER — HYDROMORPHONE HCL 1 MG/ML IJ SOLN
1.0000 mg | Freq: Once | INTRAMUSCULAR | Status: AC
  Administered 2023-10-24: 1 mg via INTRAVENOUS
  Filled 2023-10-23: qty 1

## 2023-10-23 MED ORDER — IOHEXOL 300 MG/ML  SOLN
100.0000 mL | Freq: Once | INTRAMUSCULAR | Status: AC | PRN
  Administered 2023-10-23: 100 mL via INTRAVENOUS

## 2023-10-23 NOTE — ED Triage Notes (Signed)
 Pt c/o LLQ abdominal pain x 3 days. Denies n/v/d.

## 2023-10-24 DIAGNOSIS — K659 Peritonitis, unspecified: Secondary | ICD-10-CM | POA: Diagnosis not present

## 2023-10-24 LAB — URINALYSIS, ROUTINE W REFLEX MICROSCOPIC
Bacteria, UA: NONE SEEN
Bilirubin Urine: NEGATIVE
Glucose, UA: NEGATIVE mg/dL
Hgb urine dipstick: NEGATIVE
Ketones, ur: NEGATIVE mg/dL
Leukocytes,Ua: NEGATIVE
Nitrite: NEGATIVE
Protein, ur: 30 mg/dL — AB
Specific Gravity, Urine: >1.046 — ABNORMAL HIGH (ref 1.005–1.030)
pH: 5 (ref 5.0–8.0)

## 2023-10-24 MED ORDER — NAPROXEN 500 MG PO TABS: 500.0000 mg | ORAL_TABLET | Freq: Two times a day (BID) | ORAL | 0 refills | Status: AC

## 2023-10-24 MED ORDER — OXYCODONE-ACETAMINOPHEN 5-325 MG PO TABS: 1.0000 | ORAL_TABLET | Freq: Four times a day (QID) | ORAL | 0 refills | Status: AC | PRN

## 2023-10-24 MED ORDER — AMOXICILLIN-POT CLAVULANATE 875-125 MG PO TABS: 1.0000 | ORAL_TABLET | Freq: Two times a day (BID) | ORAL | 0 refills | Status: AC

## 2023-10-24 NOTE — ED Provider Notes (Signed)
  EMERGENCY DEPARTMENT AT [facility] Provider Note   CSN: [id number] Arrival date & time: 10/23/23  1638     History  Chief Complaint  Patient presents with   Abdominal Pain    Clinton Murphy is a 53 y.o. male.  53 yo M with a couple days of left lower quadrant pain which worsened tonight. No n/v/d, constipation, trauma or fevers. No h/o similar.    Abdominal Pain      Home Medications Prior to Admission medications   Medication Sig Start Date End Date Taking? Authorizing Provider  amoxicillin -clavulanate (AUGMENTIN ) 875-125 MG tablet Take 1 tablet by mouth every 12 (twelve) hours. 10/24/23  Yes Broghan Pannone, Reymundo Caulk, MD  naproxen  (NAPROSYN ) 500 MG tablet Take 1 tablet (500 mg total) by mouth 2 (two) times daily. 10/24/23  Yes Rossi Burdo, Reymundo Caulk, MD  oxyCODONE -acetaminophen  (PERCOCET/ROXICET) 5-325 MG tablet Take 1 tablet by mouth every 6 (six) hours as needed for severe pain (pain score 7-10). 10/24/23  Yes Naziya Hegwood, Reymundo Caulk, MD  acetaminophen  (TYLENOL ) 500 MG tablet Take 500 mg by mouth every 6 (six) hours as needed for mild pain or moderate pain.    [provider]  cyclobenzaprine  (FLEXERIL ) 5 MG tablet Take 5 mg by mouth 3 (three) times daily as needed. 07/01/21   [provider]  dexamethasone  (DECADRON ) 4 MG tablet Take 1 tablet (4 mg total) by mouth as needed (every other day as needed for severe lower back pain). 06/26/21   Lindle Rhea, MD  famotidine  (PEPCID ) 20 MG tablet Take 1 tablet (20 mg total) by mouth 2 (two) times daily. 05/29/16   Jerilynn Montenegro, MD  gabapentin  (NEURONTIN ) 300 MG capsule Take 1 capsule (300 mg total) by mouth 3 (three) times daily. 06/23/21   Sherel Dikes, PA-C  HYDROcodone -acetaminophen  (NORCO) 10-325 MG tablet Take 1 tablet by mouth 4 (four) times daily as needed. 07/01/21   [provider]  ibuprofen (ADVIL,MOTRIN) 200 MG tablet Take 200 mg by mouth every 6 (six) hours as needed for mild pain or  moderate pain.    [provider]  lisinopril  (ZESTRIL ) 20 MG tablet Take 20 mg by mouth daily.    [provider]  naproxen  sodium (ALEVE ) 220 MG tablet Take 220 mg by mouth.    [provider]  omeprazole (PRILOSEC) 40 MG capsule Take 40 mg by mouth daily.    [provider]  ondansetron  (ZOFRAN  ODT) 8 MG disintegrating tablet Take 1 tablet (8 mg total) by mouth every 8 (eight) hours as needed for nausea or vomiting. Patient not taking: Reported on 07/03/2021 05/29/16   Jerilynn Montenegro, MD  pantoprazole  (PROTONIX ) 40 MG tablet Take 1 tablet (40 mg total) by mouth daily. Patient not taking: Reported on 07/03/2021 05/29/16   Jerilynn Montenegro, MD  predniSONE (DELTASONE) 10 MG tablet prednisone 10 mg tablet Patient not taking: Reported on 07/03/2021    [provider]      Allergies    Patient has no known allergies.    Review of Systems   Review of Systems  Gastrointestinal:  Positive for abdominal pain.    Physical Exam Updated Vital Signs BP 130/83   Pulse 65   Temp 98.3 F (36.8 C) (Oral)   Resp 18   Ht 5\' 5"  (1.651 m)   Wt 83.9 kg   SpO2 96%   BMI 30.79 kg/m  Physical Exam Vitals and nursing note reviewed.  Constitutional:      Appearance: He is well-developed.  HENT:     Head: Normocephalic and atraumatic.  Cardiovascular:     Rate and Rhythm: Normal rate.  Pulmonary:     Effort: Pulmonary effort is normal. No respiratory distress.  Abdominal:     General: There is no distension.     Tenderness: There is abdominal tenderness in the left lower quadrant.  Musculoskeletal:        General: Normal range of motion.     Cervical back: Normal range of motion.  Skin:    General: Skin is warm and dry.  Neurological:     Mental Status: He is alert.     ED Results / Procedures / Treatments   Labs (all labs ordered are listed, but only abnormal results are displayed) Labs Reviewed  COMPREHENSIVE METABOLIC PANEL WITH GFR -  Abnormal; Notable for the following components:      Result Value   CO2 21 (*)    Glucose, Bld 144 (*)    All other components within normal limits  URINALYSIS, ROUTINE W REFLEX MICROSCOPIC - Abnormal; Notable for the following components:   Specific Gravity, Urine >1.046 (*)    Protein, ur 30 (*)    All other components within normal limits  LIPASE, BLOOD  CBC    EKG None  Radiology CT ABDOMEN PELVIS W CONTRAST Result Date: 10/24/2023 CLINICAL DATA:  Left lower quadrant pain for 3 days EXAM: CT ABDOMEN AND PELVIS WITH CONTRAST TECHNIQUE: Multidetector CT imaging of the abdomen and pelvis was performed using the standard protocol following bolus administration of intravenous contrast. RADIATION DOSE REDUCTION: This exam was performed according to the departmental dose-optimization program which includes automated exposure control, adjustment of the mA and/or kV according to patient size and/or use of iterative reconstruction technique. CONTRAST:  OMNIPAQUE  IOHEXOL  300 MG/ML  SOLN COMPARISON:  10/04/2009 FINDINGS: Lower chest:  No contributory findings. Hepatobiliary: There is likely hepatic steatosis.Geographic hyperdensity around the gallbladder fossa attributed to perfusion or fatty sparing. Unremarkable gallbladder and bile ducts. Pancreas: Unremarkable. Spleen: Unremarkable. Adrenals/Urinary Tract: Negative adrenals. No hydronephrosis or stone. Unremarkable bladder. Stomach/Bowel: Inflammatory stranding surrounds a fatty lobule along the anti mesenteric border of the descending colon. No regional diverticulum. No inflammatory bowel wall thickening. Vascular/Lymphatic: No acute vascular abnormality. Atheromatous calcification of the aorta that is mild. No mass or adenopathy. Reproductive:No pathologic findings. Other: No ascites or pneumoperitoneum. Fatty enlargement of the bilateral inguinal canal, greater on the right. Musculoskeletal: No acute abnormalities. L5-S1 PLIF with solid  arthrodesis. Notable degeneration at L3-4 and L4-5. IMPRESSION: Epiploic appendagitis along the descending colon. Probable hepatic steatosis. Fatty enlargement of the right more than left inguinal canal, possible hernia. Electronically Signed   By: Ronnette Coke M.D.   On: 10/24/2023 04:23    Procedures Procedures    Medications Ordered in ED Medications  iohexol  (OMNIPAQUE ) 300 MG/ML solution 100 mL (100 mLs Intravenous Contrast Given 10/23/23 2252)  piperacillin -tazobactam (ZOSYN ) IVPB 3.375 g (0 g Intravenous Stopped 10/24/23 0027)  HYDROmorphone  (DILAUDID ) injection 1 mg (1 mg Intravenous Given 10/24/23 0004)  docusate sodium  (COLACE) capsule 100 mg (100 mg Oral Given 10/24/23 0003)    ED Course/ Medical Decision Making/ A&P                                 Medical Decision Making Amount and/or Complexity of Data Reviewed Labs: ordered.  Risk OTC drugs. Prescription drug management.   On my CT interpretation, it appeared  to have some stranding around the colon in LLQ so initiated abx for diverticulitis. Radiology read concerning for eepiploic appendagitis, will continue abx but add on NSAIDs as well. Stable for d/c VS WNL.   Final Clinical Impression(s) / ED Diagnoses Final diagnoses:  Epiploic appendagitis    Rx / DC Orders ED Discharge Orders          Ordered    oxyCODONE -acetaminophen  (PERCOCET/ROXICET) 5-325 MG tablet  Every 6 hours PRN        10/24/23 0441    naproxen  (NAPROSYN ) 500 MG tablet  2 times daily        10/24/23 0441    amoxicillin -clavulanate (AUGMENTIN ) 875-125 MG tablet  Every 12 hours        10/24/23 0441              Miah Boye, Reymundo Caulk, MD 10/24/23 6578

## 2023-10-25 MED FILL — Oxycodone w/ Acetaminophen Tab 5-325 MG: ORAL | Qty: 6 | Status: AC
